# Patient Record
Sex: Female | Born: 1935 | Race: White | Hispanic: No | Marital: Single | Smoking: Former smoker
Health system: Southern US, Community
[De-identification: ages and names within clinical notes are randomized; demographics above are authoritative.]

## PROBLEM LIST (undated history)

## (undated) DIAGNOSIS — H409 Unspecified glaucoma: Secondary | ICD-10-CM

## (undated) DIAGNOSIS — L719 Rosacea, unspecified: Secondary | ICD-10-CM

## (undated) DIAGNOSIS — K529 Noninfective gastroenteritis and colitis, unspecified: Secondary | ICD-10-CM

## (undated) DIAGNOSIS — K219 Gastro-esophageal reflux disease without esophagitis: Secondary | ICD-10-CM

## (undated) DIAGNOSIS — M1990 Unspecified osteoarthritis, unspecified site: Secondary | ICD-10-CM

## (undated) DIAGNOSIS — C801 Malignant (primary) neoplasm, unspecified: Secondary | ICD-10-CM

## (undated) DIAGNOSIS — I10 Essential (primary) hypertension: Secondary | ICD-10-CM

## (undated) HISTORY — PX: PARTIAL HYSTERECTOMY: SHX80

## (undated) HISTORY — PX: OTHER SURGICAL HISTORY: SHX169

---

## 1999-12-16 ENCOUNTER — Encounter (INDEPENDENT_AMBULATORY_CARE_PROVIDER_SITE_OTHER): Payer: Self-pay | Admitting: Specialist

## 1999-12-16 ENCOUNTER — Other Ambulatory Visit: Admission: RE | Admit: 1999-12-16 | Discharge: 1999-12-16 | Payer: Self-pay | Admitting: Gastroenterology

## 2005-11-25 ENCOUNTER — Encounter: Admission: RE | Admit: 2005-11-25 | Discharge: 2005-11-25 | Payer: Self-pay | Admitting: Internal Medicine

## 2008-05-25 ENCOUNTER — Encounter: Admission: RE | Admit: 2008-05-25 | Discharge: 2008-05-25 | Payer: Self-pay | Admitting: Family Medicine

## 2008-09-26 ENCOUNTER — Encounter: Admission: RE | Admit: 2008-09-26 | Discharge: 2008-09-26 | Payer: Self-pay | Admitting: Family Medicine

## 2012-02-11 ENCOUNTER — Other Ambulatory Visit: Payer: Self-pay | Admitting: Physician Assistant

## 2013-06-14 ENCOUNTER — Other Ambulatory Visit: Payer: Self-pay | Admitting: Gastroenterology

## 2013-06-14 DIAGNOSIS — R112 Nausea with vomiting, unspecified: Secondary | ICD-10-CM

## 2013-06-14 DIAGNOSIS — R1031 Right lower quadrant pain: Secondary | ICD-10-CM

## 2013-06-15 ENCOUNTER — Observation Stay (HOSPITAL_COMMUNITY): Payer: Medicare Other | Admitting: Anesthesiology

## 2013-06-15 ENCOUNTER — Encounter (HOSPITAL_COMMUNITY): Payer: Medicare Other | Admitting: Anesthesiology

## 2013-06-15 ENCOUNTER — Ambulatory Visit
Admission: RE | Admit: 2013-06-15 | Discharge: 2013-06-15 | Disposition: A | Discharge status: Home / Self Care | Payer: Medicare Other | Source: Ambulatory Visit | Attending: Gastroenterology | Admitting: Gastroenterology

## 2013-06-15 ENCOUNTER — Emergency Department (HOSPITAL_COMMUNITY): Payer: Medicare Other

## 2013-06-15 ENCOUNTER — Encounter (HOSPITAL_COMMUNITY): Payer: Self-pay | Admitting: Emergency Medicine

## 2013-06-15 ENCOUNTER — Observation Stay (HOSPITAL_COMMUNITY)
Admission: EM | Admit: 2013-06-15 | Discharge: 2013-06-16 | Disposition: A | Discharge status: Home / Self Care | Payer: Medicare Other | Attending: General Surgery | Admitting: General Surgery

## 2013-06-15 ENCOUNTER — Encounter (HOSPITAL_COMMUNITY)
Admission: EM | Disposition: A | Discharge status: Home / Self Care | Payer: Self-pay | Source: Home / Self Care | Attending: Emergency Medicine

## 2013-06-15 DIAGNOSIS — R1031 Right lower quadrant pain: Secondary | ICD-10-CM

## 2013-06-15 DIAGNOSIS — R112 Nausea with vomiting, unspecified: Secondary | ICD-10-CM

## 2013-06-15 DIAGNOSIS — H409 Unspecified glaucoma: Secondary | ICD-10-CM | POA: Insufficient documentation

## 2013-06-15 DIAGNOSIS — K3580 Unspecified acute appendicitis: Principal | ICD-10-CM | POA: Diagnosis present

## 2013-06-15 DIAGNOSIS — Z79899 Other long term (current) drug therapy: Secondary | ICD-10-CM | POA: Insufficient documentation

## 2013-06-15 DIAGNOSIS — E785 Hyperlipidemia, unspecified: Secondary | ICD-10-CM | POA: Insufficient documentation

## 2013-06-15 DIAGNOSIS — F17200 Nicotine dependence, unspecified, uncomplicated: Secondary | ICD-10-CM | POA: Insufficient documentation

## 2013-06-15 DIAGNOSIS — K37 Unspecified appendicitis: Secondary | ICD-10-CM

## 2013-06-15 DIAGNOSIS — I10 Essential (primary) hypertension: Secondary | ICD-10-CM | POA: Insufficient documentation

## 2013-06-15 HISTORY — DX: Unspecified glaucoma: H40.9

## 2013-06-15 HISTORY — DX: Noninfective gastroenteritis and colitis, unspecified: K52.9

## 2013-06-15 HISTORY — DX: Essential (primary) hypertension: I10

## 2013-06-15 HISTORY — DX: Rosacea, unspecified: L71.9

## 2013-06-15 HISTORY — PX: LAPAROSCOPIC APPENDECTOMY: SHX408

## 2013-06-15 LAB — CBC WITH DIFFERENTIAL/PLATELET
BASOS ABS: 0 10*3/uL (ref 0.0–0.1)
BASOS PCT: 0 % (ref 0–1)
EOS ABS: 0 10*3/uL (ref 0.0–0.7)
Eosinophils Relative: 0 % (ref 0–5)
HCT: 40.3 % (ref 36.0–46.0)
HEMOGLOBIN: 14.5 g/dL (ref 12.0–15.0)
Lymphocytes Relative: 7 % — ABNORMAL LOW (ref 12–46)
Lymphs Abs: 1 10*3/uL (ref 0.7–4.0)
MCH: 32.5 pg (ref 26.0–34.0)
MCHC: 36 g/dL (ref 30.0–36.0)
MCV: 90.4 fL (ref 78.0–100.0)
MONOS PCT: 9 % (ref 3–12)
Monocytes Absolute: 1.4 10*3/uL — ABNORMAL HIGH (ref 0.1–1.0)
NEUTROS ABS: 12.7 10*3/uL — AB (ref 1.7–7.7)
NEUTROS PCT: 84 % — AB (ref 43–77)
PLATELETS: 215 10*3/uL (ref 150–400)
RBC: 4.46 MIL/uL (ref 3.87–5.11)
RDW: 14.3 % (ref 11.5–15.5)
WBC: 15.1 10*3/uL — ABNORMAL HIGH (ref 4.0–10.5)

## 2013-06-15 LAB — URINALYSIS, ROUTINE W REFLEX MICROSCOPIC
BILIRUBIN URINE: NEGATIVE
Glucose, UA: NEGATIVE mg/dL
Ketones, ur: NEGATIVE mg/dL
NITRITE: NEGATIVE
PH: 5.5 (ref 5.0–8.0)
Protein, ur: NEGATIVE mg/dL
SPECIFIC GRAVITY, URINE: 1.042 — AB (ref 1.005–1.030)
UROBILINOGEN UA: 0.2 mg/dL (ref 0.0–1.0)

## 2013-06-15 LAB — BASIC METABOLIC PANEL
BUN: 16 mg/dL (ref 6–23)
CHLORIDE: 98 meq/L (ref 96–112)
CO2: 24 mEq/L (ref 19–32)
Calcium: 9.3 mg/dL (ref 8.4–10.5)
Creatinine, Ser: 0.69 mg/dL (ref 0.50–1.10)
GFR, EST NON AFRICAN AMERICAN: 83 mL/min — AB (ref >90)
Glucose, Bld: 90 mg/dL (ref 70–99)
POTASSIUM: 3.7 meq/L (ref 3.7–5.3)
SODIUM: 137 meq/L (ref 137–147)

## 2013-06-15 LAB — URINE MICROSCOPIC-ADD ON

## 2013-06-15 SURGERY — APPENDECTOMY, LAPAROSCOPIC
Anesthesia: General | Site: Abdomen

## 2013-06-15 MED ORDER — ROCURONIUM BROMIDE 50 MG/5ML IV SOLN
INTRAVENOUS | Status: AC
  Filled 2013-06-15: qty 1

## 2013-06-15 MED ORDER — LEVOCETIRIZINE DIHYDROCHLORIDE 5 MG PO TABS: 5.0000 mg | ORAL_TABLET | Freq: Every evening | ORAL | Status: DC

## 2013-06-15 MED ORDER — BUPIVACAINE-EPINEPHRINE (PF) 0.25% -1:200000 IJ SOLN
INTRAMUSCULAR | Status: AC
  Filled 2013-06-15: qty 30

## 2013-06-15 MED ORDER — FUROSEMIDE 20 MG PO TABS
20.0000 mg | ORAL_TABLET | Freq: Every day | ORAL | Status: DC
  Filled 2013-06-15: qty 1

## 2013-06-15 MED ORDER — RALOXIFENE HCL 60 MG PO TABS
60.0000 mg | ORAL_TABLET | Freq: Every day | ORAL | Status: DC
  Filled 2013-06-15: qty 1

## 2013-06-15 MED ORDER — IRBESARTAN 300 MG PO TABS
300.0000 mg | ORAL_TABLET | Freq: Every day | ORAL | Status: DC
  Filled 2013-06-15: qty 1

## 2013-06-15 MED ORDER — PHENYLEPHRINE HCL 10 MG/ML IJ SOLN
INTRAMUSCULAR | Status: DC | PRN
  Administered 2013-06-15: 80 ug via INTRAVENOUS
  Administered 2013-06-15 (×2): 120 ug via INTRAVENOUS

## 2013-06-15 MED ORDER — HYDROCODONE-ACETAMINOPHEN 5-325 MG PO TABS
1.0000 | ORAL_TABLET | ORAL | Status: DC | PRN
Start: 2013-06-15 — End: 2013-06-16

## 2013-06-15 MED ORDER — PROMETHAZINE HCL 25 MG/ML IJ SOLN: 6.2500 mg | INTRAMUSCULAR | Status: DC | PRN

## 2013-06-15 MED ORDER — LIDOCAINE HCL (CARDIAC) 20 MG/ML IV SOLN
INTRAVENOUS | Status: AC
  Filled 2013-06-15: qty 5

## 2013-06-15 MED ORDER — OMEGA-3-ACID ETHYL ESTERS 1 G PO CAPS: 2.0000 g | ORAL_CAPSULE | Freq: Two times a day (BID) | ORAL | Status: DC

## 2013-06-15 MED ORDER — GLYCOPYRROLATE 0.2 MG/ML IJ SOLN
INTRAMUSCULAR | Status: DC | PRN
  Administered 2013-06-15: 0.4 mg via INTRAVENOUS

## 2013-06-15 MED ORDER — MIDAZOLAM HCL 2 MG/2ML IJ SOLN
INTRAMUSCULAR | Status: AC
  Filled 2013-06-15: qty 2

## 2013-06-15 MED ORDER — DOXYCYCLINE HYCLATE 100 MG PO TBEC: 100.0000 mg | DELAYED_RELEASE_TABLET | Freq: Two times a day (BID) | ORAL | Status: DC

## 2013-06-15 MED ORDER — SUCCINYLCHOLINE CHLORIDE 20 MG/ML IJ SOLN
INTRAMUSCULAR | Status: DC | PRN
  Administered 2013-06-15: 100 mg via INTRAVENOUS

## 2013-06-15 MED ORDER — PROPOFOL 10 MG/ML IV BOLUS
INTRAVENOUS | Status: DC | PRN
  Administered 2013-06-15: 150 mg via INTRAVENOUS

## 2013-06-15 MED ORDER — HYDROMORPHONE HCL PF 1 MG/ML IJ SOLN: 0.5000 mg | INTRAMUSCULAR | Status: DC | PRN

## 2013-06-15 MED ORDER — EZETIMIBE-SIMVASTATIN 10-40 MG PO TABS
1.0000 | ORAL_TABLET | Freq: Every day | ORAL | Status: DC
  Administered 2013-06-15: 1 via ORAL
  Filled 2013-06-15 (×3): qty 1

## 2013-06-15 MED ORDER — BUPIVACAINE-EPINEPHRINE 0.25% -1:200000 IJ SOLN
INTRAMUSCULAR | Status: DC | PRN
  Administered 2013-06-15: 16 mL

## 2013-06-15 MED ORDER — ONDANSETRON HCL 4 MG/2ML IJ SOLN
INTRAMUSCULAR | Status: DC | PRN
  Administered 2013-06-15: 4 mg via INTRAVENOUS

## 2013-06-15 MED ORDER — DEXAMETHASONE SODIUM PHOSPHATE 10 MG/ML IJ SOLN
INTRAMUSCULAR | Status: DC | PRN
  Administered 2013-06-15: 10 mg via INTRAVENOUS

## 2013-06-15 MED ORDER — AZELAIC ACID 15 % EX GEL: 1.0000 "application " | Freq: Every day | CUTANEOUS | Status: DC

## 2013-06-15 MED ORDER — ONDANSETRON HCL 4 MG/2ML IJ SOLN: 4.0000 mg | Freq: Four times a day (QID) | INTRAMUSCULAR | Status: DC | PRN

## 2013-06-15 MED ORDER — SBI/PROTEIN ISOLATE 5 G PO PACK
5.0000 mg | PACK | Freq: Every day | ORAL | Status: DC
Start: 2013-06-15 — End: 2013-06-15

## 2013-06-15 MED ORDER — DEXAMETHASONE SODIUM PHOSPHATE 10 MG/ML IJ SOLN
INTRAMUSCULAR | Status: AC
  Filled 2013-06-15: qty 1

## 2013-06-15 MED ORDER — IOHEXOL 300 MG/ML  SOLN
100.0000 mL | Freq: Once | INTRAMUSCULAR | Status: AC | PRN
  Administered 2013-06-15: 100 mL via INTRAVENOUS

## 2013-06-15 MED ORDER — NEOSTIGMINE METHYLSULFATE 10 MG/10ML IV SOLN
INTRAVENOUS | Status: DC | PRN
  Administered 2013-06-15: 3 mg via INTRAVENOUS

## 2013-06-15 MED ORDER — ONDANSETRON HCL 4 MG/2ML IJ SOLN
4.0000 mg | Freq: Once | INTRAMUSCULAR | Status: AC
  Administered 2013-06-15: 4 mg via INTRAVENOUS
  Filled 2013-06-15: qty 2

## 2013-06-15 MED ORDER — OXYCODONE-ACETAMINOPHEN 5-325 MG PO TABS: 1.0000 | ORAL_TABLET | ORAL | Status: DC | PRN

## 2013-06-15 MED ORDER — HYDROMORPHONE HCL PF 1 MG/ML IJ SOLN: 1.0000 mg | INTRAMUSCULAR | Status: DC | PRN

## 2013-06-15 MED ORDER — ONDANSETRON HCL 4 MG PO TABS: 4.0000 mg | ORAL_TABLET | Freq: Four times a day (QID) | ORAL | Status: DC | PRN

## 2013-06-15 MED ORDER — FENTANYL CITRATE 0.05 MG/ML IJ SOLN
INTRAMUSCULAR | Status: DC | PRN
  Administered 2013-06-15: 100 ug via INTRAVENOUS

## 2013-06-15 MED ORDER — HYDROMORPHONE HCL PF 1 MG/ML IJ SOLN: 0.2500 mg | INTRAMUSCULAR | Status: DC | PRN

## 2013-06-15 MED ORDER — CIPROFLOXACIN IN D5W 400 MG/200ML IV SOLN
400.0000 mg | Freq: Two times a day (BID) | INTRAVENOUS | Status: DC
  Administered 2013-06-15 – 2013-06-16 (×2): 400 mg via INTRAVENOUS
  Filled 2013-06-15 (×3): qty 200

## 2013-06-15 MED ORDER — KCL IN DEXTROSE-NACL 20-5-0.45 MEQ/L-%-% IV SOLN
INTRAVENOUS | Status: AC
  Filled 2013-06-15: qty 1000

## 2013-06-15 MED ORDER — ASPIRIN 81 MG PO TABS: 81.0000 mg | ORAL_TABLET | Freq: Every day | ORAL | Status: DC

## 2013-06-15 MED ORDER — DOXYCYCLINE HYCLATE 100 MG PO TABS
100.0000 mg | ORAL_TABLET | Freq: Two times a day (BID) | ORAL | Status: DC
  Administered 2013-06-15: 100 mg via ORAL
  Filled 2013-06-15 (×3): qty 1

## 2013-06-15 MED ORDER — 0.9 % SODIUM CHLORIDE (POUR BTL) OPTIME
TOPICAL | Status: DC | PRN
  Administered 2013-06-15: 1000 mL

## 2013-06-15 MED ORDER — SODIUM CHLORIDE 0.9 % IR SOLN
Status: DC | PRN
  Administered 2013-06-15: 1000 mL

## 2013-06-15 MED ORDER — CIPROFLOXACIN IN D5W 400 MG/200ML IV SOLN
400.0000 mg | Freq: Two times a day (BID) | INTRAVENOUS | Status: AC
  Filled 2013-06-15: qty 200

## 2013-06-15 MED ORDER — SODIUM CHLORIDE 0.9 % IV SOLN: INTRAVENOUS | Status: DC

## 2013-06-15 MED ORDER — LIDOCAINE HCL (CARDIAC) 20 MG/ML IV SOLN
INTRAVENOUS | Status: DC | PRN
  Administered 2013-06-15: 80 mg via INTRAVENOUS

## 2013-06-15 MED ORDER — ENOXAPARIN SODIUM 40 MG/0.4ML ~~LOC~~ SOLN: 40.0000 mg | SUBCUTANEOUS | Status: DC

## 2013-06-15 MED ORDER — ONDANSETRON HCL 4 MG/2ML IJ SOLN
INTRAMUSCULAR | Status: AC
  Filled 2013-06-15: qty 2

## 2013-06-15 MED ORDER — ROCURONIUM BROMIDE 100 MG/10ML IV SOLN
INTRAVENOUS | Status: DC | PRN
  Administered 2013-06-15: 25 mg via INTRAVENOUS

## 2013-06-15 MED ORDER — MORPHINE SULFATE 4 MG/ML IJ SOLN
6.0000 mg | Freq: Once | INTRAMUSCULAR | Status: AC
  Administered 2013-06-15: 6 mg via INTRAVENOUS
  Filled 2013-06-15: qty 2

## 2013-06-15 MED ORDER — FENTANYL CITRATE 0.05 MG/ML IJ SOLN
INTRAMUSCULAR | Status: AC
  Filled 2013-06-15: qty 5

## 2013-06-15 MED ORDER — ACETAMINOPHEN 325 MG PO TABS: 650.0000 mg | ORAL_TABLET | Freq: Four times a day (QID) | ORAL | Status: DC | PRN

## 2013-06-15 MED ORDER — GLYCOPYRROLATE 0.2 MG/ML IJ SOLN
INTRAMUSCULAR | Status: AC
  Filled 2013-06-15: qty 2

## 2013-06-15 MED ORDER — LACTATED RINGERS IV SOLN
INTRAVENOUS | Status: DC
  Administered 2013-06-15: 13:00:00 via INTRAVENOUS

## 2013-06-15 MED ORDER — HEPARIN SODIUM (PORCINE) 5000 UNIT/ML IJ SOLN
5000.0000 [IU] | Freq: Three times a day (TID) | INTRAMUSCULAR | Status: DC
  Administered 2013-06-16: 5000 [IU] via SUBCUTANEOUS
  Filled 2013-06-15 (×3): qty 1

## 2013-06-15 MED ORDER — KCL IN DEXTROSE-NACL 20-5-0.45 MEQ/L-%-% IV SOLN
INTRAVENOUS | Status: DC
  Administered 2013-06-15: 90 mL/h via INTRAVENOUS
  Filled 2013-06-15 (×3): qty 1000

## 2013-06-15 MED ORDER — ACETAMINOPHEN 650 MG RE SUPP: 650.0000 mg | Freq: Four times a day (QID) | RECTAL | Status: DC | PRN

## 2013-06-15 MED ORDER — LORATADINE 10 MG PO TABS
5.0000 mg | ORAL_TABLET | Freq: Every day | ORAL | Status: DC
  Administered 2013-06-15: 5 mg via ORAL
  Filled 2013-06-15: qty 0.5
  Filled 2013-06-15: qty 1

## 2013-06-15 MED ORDER — LATANOPROST 0.005 % OP SOLN: 1.0000 [drp] | Freq: Every day | OPHTHALMIC | Status: DC

## 2013-06-15 MED ORDER — PROPOFOL 10 MG/ML IV BOLUS
INTRAVENOUS | Status: AC
  Filled 2013-06-15: qty 20

## 2013-06-15 SURGICAL SUPPLY — 50 items
APPLIER CLIP ROT 10 11.4 M/L (STAPLE)
BLADE SURG ROTATE 9660 (MISCELLANEOUS) ×3 IMPLANT
CANISTER SUCTION 2500CC (MISCELLANEOUS) ×3 IMPLANT
CHLORAPREP W/TINT 26ML (MISCELLANEOUS) ×3 IMPLANT
CLIP APPLIE ROT 10 11.4 M/L (STAPLE) IMPLANT
CLOSURE WOUND 1/2 X4 (GAUZE/BANDAGES/DRESSINGS) ×1
COVER SURGICAL LIGHT HANDLE (MISCELLANEOUS) ×3 IMPLANT
CUTTER LINEAR ENDO 35 ETS (STAPLE) ×3 IMPLANT
CUTTER LINEAR ENDO 35 ETS TH (STAPLE) IMPLANT
DECANTER SPIKE VIAL GLASS SM (MISCELLANEOUS) IMPLANT
DERMABOND ADVANCED (GAUZE/BANDAGES/DRESSINGS) ×2
DERMABOND ADVANCED .7 DNX12 (GAUZE/BANDAGES/DRESSINGS) ×1 IMPLANT
DRAPE UTILITY 15X26 W/TAPE STR (DRAPE) ×6 IMPLANT
DRSG TEGADERM 2-3/8X2-3/4 SM (GAUZE/BANDAGES/DRESSINGS) ×9 IMPLANT
ELECT REM PT RETURN 9FT ADLT (ELECTROSURGICAL) ×3
ELECTRODE REM PT RTRN 9FT ADLT (ELECTROSURGICAL) ×1 IMPLANT
ENDOLOOP SUT PDS II  0 18 (SUTURE)
ENDOLOOP SUT PDS II 0 18 (SUTURE) IMPLANT
GLOVE BIO SURGEON STRL SZ7.5 (GLOVE) ×3 IMPLANT
GLOVE BIOGEL PI IND STRL 6.5 (GLOVE) ×2 IMPLANT
GLOVE BIOGEL PI IND STRL 7.5 (GLOVE) ×1 IMPLANT
GLOVE BIOGEL PI IND STRL 8 (GLOVE) ×1 IMPLANT
GLOVE BIOGEL PI INDICATOR 6.5 (GLOVE) ×4
GLOVE BIOGEL PI INDICATOR 7.5 (GLOVE) ×2
GLOVE BIOGEL PI INDICATOR 8 (GLOVE) ×2
GLOVE ECLIPSE 7.5 STRL STRAW (GLOVE) ×3 IMPLANT
GLOVE SURG SS PI 7.0 STRL IVOR (GLOVE) ×3 IMPLANT
GOWN STRL REUS W/ TWL LRG LVL3 (GOWN DISPOSABLE) ×2 IMPLANT
GOWN STRL REUS W/TWL LRG LVL3 (GOWN DISPOSABLE) ×4
KIT BASIN OR (CUSTOM PROCEDURE TRAY) ×3 IMPLANT
KIT ROOM TURNOVER OR (KITS) ×3 IMPLANT
NS IRRIG 1000ML POUR BTL (IV SOLUTION) ×3 IMPLANT
PAD ARMBOARD 7.5X6 YLW CONV (MISCELLANEOUS) ×6 IMPLANT
PENCIL BUTTON HOLSTER BLD 10FT (ELECTRODE) IMPLANT
POUCH SPECIMEN RETRIEVAL 10MM (ENDOMECHANICALS) ×3 IMPLANT
RELOAD /EVU35 (ENDOMECHANICALS) IMPLANT
RELOAD CUTTER ETS 35MM STAND (ENDOMECHANICALS) IMPLANT
SCALPEL HARMONIC ACE (MISCELLANEOUS) ×3 IMPLANT
SET IRRIG TUBING LAPAROSCOPIC (IRRIGATION / IRRIGATOR) ×3 IMPLANT
SPECIMEN JAR SMALL (MISCELLANEOUS) ×3 IMPLANT
STRIP CLOSURE SKIN 1/2X4 (GAUZE/BANDAGES/DRESSINGS) ×2 IMPLANT
SUT MNCRL AB 4-0 PS2 18 (SUTURE) ×3 IMPLANT
TOWEL OR 17X24 6PK STRL BLUE (TOWEL DISPOSABLE) IMPLANT
TOWEL OR 17X26 10 PK STRL BLUE (TOWEL DISPOSABLE) ×3 IMPLANT
TRAY FOLEY CATH 16FR SILVER (SET/KITS/TRAYS/PACK) ×3 IMPLANT
TRAY LAPAROSCOPIC (CUSTOM PROCEDURE TRAY) ×3 IMPLANT
TROCAR XCEL 12X100 BLDLESS (ENDOMECHANICALS) ×3 IMPLANT
TROCAR XCEL BLUNT TIP 100MML (ENDOMECHANICALS) ×3 IMPLANT
TROCAR XCEL NON-BLD 5MMX100MML (ENDOMECHANICALS) ×3 IMPLANT
WATER STERILE IRR 1000ML POUR (IV SOLUTION) IMPLANT

## 2013-06-15 NOTE — H&P (Signed)
For surgery soon.  Talked with the patient and her son.  Ready for the OR.  Alicia Rhodes. Dahlia Bailiff, MD, [place] [phone] [facility]

## 2013-06-15 NOTE — Anesthesia Preprocedure Evaluation (Signed)
Anesthesia Evaluation  Patient identified by MRN, date of birth, ID band Patient awake    Reviewed: Allergy & Precautions, H&P , NPO status , Patient's Chart, lab work & pertinent test results  Airway Mallampati: II TM Distance: <3 FB Neck ROM: Full    Dental no notable dental hx.    Pulmonary Current Smoker,  breath sounds clear to auscultation  Pulmonary exam normal       Cardiovascular hypertension, Pt. on medications Rhythm:Regular Rate:Normal     Neuro/Psych negative neurological ROS  negative psych ROS   GI/Hepatic negative GI ROS, Neg liver ROS,   Endo/Other  negative endocrine ROS  Renal/GU negative Renal ROS  negative genitourinary   Musculoskeletal negative musculoskeletal ROS (+)   Abdominal   Peds negative pediatric ROS (+)  Hematology negative hematology ROS (+)   Anesthesia Other Findings   Reproductive/Obstetrics negative OB ROS                           Anesthesia Physical Anesthesia Plan  ASA: II  Anesthesia Plan: General   Post-op Pain Management:    Induction: Intravenous  Airway Management Planned: Oral ETT  Additional Equipment:   Intra-op Plan:   Post-operative Plan: Extubation in OR  Informed Consent: I have reviewed the patients History and Physical, chart, labs and discussed the procedure including the risks, benefits and alternatives for the proposed anesthesia with the patient or authorized representative who has indicated his/her understanding and acceptance.   Dental advisory given  Plan Discussed with: CRNA and Surgeon  Anesthesia Plan Comments:         Anesthesia Quick Evaluation  

## 2013-06-15 NOTE — Op Note (Signed)
OPERATIVE REPORT  DATE OF OPERATION: 06/15/2013  PATIENT:  Alicia Rhodes  78 y.o. female  PRE-OPERATIVE DIAGNOSIS:  acute appendicitis  POST-OPERATIVE DIAGNOSIS:  acute appendicitis  PROCEDURE:  Procedure(s): APPENDECTOMY LAPAROSCOPIC  SURGEON:  Surgeon(s): Gwenyth Ober, MD  ASSISTANT: None  ANESTHESIA:   general  EBL: <30 ml  BLOOD ADMINISTERED: none  DRAINS: none   SPECIMEN:  Source of Specimen:  Gangrenous appendix  COUNTS CORRECT:  YES  PROCEDURE DETAILS: The patient was taken to the operating room and placed on the table in supine position. After an adequate endotracheal anesthetic was administered she was prepped and draped in the usual sterile manner exposing her abdomen.  A proper timeout was performed identifying the patient and the procedure to be performed. A supraumbilical midline incision was made using a #15 blade and taken down to the midline fascia. We incised in the midline fascia and then bluntly dissected down into the peritoneal cavity. A pursestring suture of 0 Vicryl was passed around the fascial opening which secured in a Hassan cannula.  Through the [facility] cannula carbon dioxide gas was insufflated into the peritoneal cavity up to a maximal intra-abdominal pressure of 15 mm mercury. Once this was done right upper quadrant 5 mm cannula and the left low quadrant 1112 mm cannula passed under direct vision.  Is noted that those inflammatory process in the right low quadrant. Upon dissecting out the appendix was noted to be gangrenous but not perforated. We will able to isolate the knees appendix from the base of the appendix and using a Harmonic Scalpel we were able to take down the mesoappendix. This left the base of the appendix which was enlarged. We came across the base at the cecum using an Endo GIA 35 mm stapler.  Once this was done the appendix was completely detached. We passed the appendix into an Endo Catch bag which we retrieved from the left low  quadrant cannula site. Once this was done we reinserted the cannula and irrigated the right lower quadrant with saline. Electrocautery was used to attain more hemostasis. Once this was done we aspirated all fluid and gas from the abdominal cavity. We removed the cannulas. The supraumbilical cannula fascia site was closed using the pursestring suture which was in place. 0.25% Marcaine with epinephrine was injected all sites. We closed the skin using running subcuticular stitch of 4-0 Monocryl. All needle counts, sponge counts, and instrument counts were correct.  PATIENT DISPOSITION:  PACU - hemodynamically stable.   Gwenyth Ober 5/28/20153:30 PM

## 2013-06-15 NOTE — H&P (Signed)
Chief Complaint: RLQ abdominal pain  HPI: Alicia Rhodes is a 78 year old female with a history of HTN, colitis, HLD, glaucoma, partial hysterectomy who presents to [facility] with RLQ abdominal pain.  Duration of symptoms 2 days.  Onset was sudden around 11PM on Tuesday night. Time pattern is constant.  Waxes and wanes in severity.  Moderate to severity in severity.  Aggravating factors; none.  Alleviating factors; none.  Modifying factors; none. Characterized as sharp pain which improves to a constant dull ache.  Associating factors; vomiting x2 episodes on Tuesday.  Her pain was initially generalized, then migrated to the RLQ and improved after episodes of emesis.  She was seen by Dr. Benson Norway yesterday and referred for a CT scan.  CT scan of abdomen and pelvis revealed acute appendicitis with inflammatory changes, no definite abscess.  White count of 15K.  Vital signs are stable.  She is afebrile.  Normal renal function.  She does not take any blood thinners.  She takes voltaren for knee pain.  She smokes 2-3 cigarettes per day.  Denies alcohol or drug use.    Past Medical History  Diagnosis Date  . Colitis   . Hypertension   . Glaucoma   . Rosacea     Past Surgical History  Procedure Laterality Date  . Partial hysterectomy     Home Medication: ASA 81mg  PO daily Finacea 16% cream 1 application daily lumigan .01% solution 1 drop to both eyes at bedtime Calcium 1200-D3  1 table PO BID Voltaren 50mg  PO BID Doxycycline 100mg  PO BID Vytorin 10-40mg  1 tablet PO daily vanos 1.09% cream 1 application topically as needed(rosacea) Lasix 20mg  PO daily(swelling, no hx CHF) Xyzal 5mg  at bedtime benicar 40mg  1 tablet PO daily lovaza 1 gram 2grams by mouth BID evista 60mg  1 tablet PO daily Enteragam 5 G Pack take 5mg  by mouth at bedtime Retin-A 6.04% cream 1 application topically at bedtime History reviewed. No pertinent family history. Social History:  reports that she has been smoking Cigarettes.  She  has been smoking about 0.00 packs per day. She does not have any smokeless tobacco history on file. She reports that she does not drink alcohol or use illicit drugs.  Allergies:  Allergies  Allergen Reactions  . Cyclobenzaprine Hives  . Naproxen Sodium Hives  . Penicillins Hives   Filed Vitals:   06/15/13 1115  BP: 132/61  Pulse: 97  Temp:   Resp:     (Not in a hospital admission)  Results for orders placed during the hospital encounter of 06/15/13 (from the past 48 hour(s))  CBC WITH DIFFERENTIAL     Status: Abnormal   Collection Time    06/15/13 11:14 AM      Result Value Ref Range   WBC 15.1 (*) 4.0 - 10.5 K/uL   RBC 4.46  3.87 - 5.11 MIL/uL   Hemoglobin 14.5  12.0 - 15.0 g/dL   HCT 40.3  36.0 - 46.0 %   MCV 90.4  78.0 - 100.0 fL   MCH 32.5  26.0 - 34.0 pg   MCHC 36.0  30.0 - 36.0 g/dL   RDW 14.3  11.5 - 15.5 %   Platelets 215  150 - 400 K/uL   Neutrophils Relative % 84 (*) 43 - 77 %   Neutro Abs 12.7 (*) 1.7 - 7.7 K/uL   Lymphocytes Relative 7 (*) 12 - 46 %   Lymphs Abs 1.0  0.7 - 4.0 K/uL   Monocytes Relative 9  3 - 12 %   Monocytes Absolute 1.4 (*) 0.1 - 1.0 K/uL   Eosinophils Relative 0  0 - 5 %   Eosinophils Absolute 0.0  0.0 - 0.7 K/uL   Basophils Relative 0  0 - 1 %   Basophils Absolute 0.0  0.0 - 0.1 K/uL  URINALYSIS, ROUTINE W REFLEX MICROSCOPIC     Status: Abnormal   Collection Time    06/15/13 11:27 AM      Result Value Ref Range   Color, Urine YELLOW  YELLOW   APPearance HAZY (*) CLEAR   Specific Gravity, Urine 1.042 (*) 1.005 - 1.030   pH 5.5  5.0 - 8.0   Glucose, UA NEGATIVE  NEGATIVE mg/dL   Hgb urine dipstick SMALL (*) NEGATIVE   Bilirubin Urine NEGATIVE  NEGATIVE   Ketones, ur NEGATIVE  NEGATIVE mg/dL   Protein, ur NEGATIVE  NEGATIVE mg/dL   Urobilinogen, UA 0.2  0.0 - 1.0 mg/dL   Nitrite NEGATIVE  NEGATIVE   Leukocytes, UA SMALL (*) NEGATIVE  URINE MICROSCOPIC-ADD ON     Status: Abnormal   Collection Time    06/15/13 11:27 AM       Result Value Ref Range   Squamous Epithelial / LPF MANY (*) RARE   WBC, UA 3-6  <3 WBC/hpf   RBC / HPF 3-6  <3 RBC/hpf   Bacteria, UA FEW (*) RARE   Dg Chest 2 View  06/15/2013   CLINICAL DATA:  Pre operative respiratory exam for appendectomy.  EXAM: CHEST - 2 VIEW  COMPARISON:  None  FINDINGS: The heart size and mediastinal contours are within normal limits. The anterior right hemidiaphragm shows eventration. Mild bibasilar atelectasis is present. There is no evidence of pulmonary edema, consolidation, pneumothorax, nodule or pleural fluid. The bony thorax is unremarkable.  IMPRESSION: No active disease.  Elevation of anterior right hemidiaphragm.   Electronically Signed   By: Aletta Edouard M.D.   On: 06/15/2013 12:30   Ct Abdomen Pelvis W Contrast  06/15/2013   CLINICAL DATA:  Right lower quadrant pain with nausea and vomiting for 2 days  EXAM: CT ABDOMEN AND PELVIS WITH CONTRAST  TECHNIQUE: Multidetector CT imaging of the abdomen and pelvis was performed using the standard protocol following bolus administration of intravenous contrast.  CONTRAST:  164mL OMNIPAQUE IOHEXOL 300 MG/ML  SOLN  COMPARISON:  None.  FINDINGS: The lung bases are clear. A simple appearing cyst is noted in the lower right lobe of liver. No other hepatic abnormality is noted. The gallbladder is well seen with no calcified gallstones noted. The pancreas is normal in size and the pancreatic duct is not dilated. The adrenal glands and spleen are unremarkable. The stomach is moderately fluid distended with no abnormality noted. The kidneys enhance with no calculus or mass and on delayed images, the pelvocaliceal systems are unremarkable. The abdominal aorta is normal in caliber. No adenopathy is seen.  Within the right lower quadrant there is a dilated tubular structure measuring up to 14 mm with adjacent inflammatory reaction, consistent with acute appendicitis. There is edema of the base of the cecum at the appendiceal orifice as  well. No abscess is seen although slightly prominent lymph nodes are present within the surrounding soft tissues.  The urinary bladder is unremarkable. No free fluid is seen within the pelvis. There is a moderate amount of feces throughout the colon. There are mild degenerative changes throughout the lumbar spine particularly involving the facet joints.  IMPRESSION: Acute appendicitis  with inflammatory reaction in the right lower quadrant but no discrete abscess or other complicating feature. This report will be called to Dr. Benson Norway by the PRA.   Electronically Signed   By: Ivar Drape M.D.   On: 06/15/2013 10:05    Review of Systems  Constitutional: Positive for weight loss and malaise/fatigue. Negative for fever, chills and diaphoresis.  HENT: Negative for hearing loss.   Eyes: Negative for blurred vision and double vision.  Respiratory: Negative for shortness of breath and wheezing.   Cardiovascular: Negative for chest pain, palpitations and leg swelling.  Gastrointestinal: Positive for abdominal pain and diarrhea. Negative for nausea, vomiting, constipation, blood in stool and melena.  Genitourinary: Negative for dysuria, urgency, frequency, hematuria and flank pain.  Musculoskeletal: Negative for back pain and myalgias.  Neurological: Negative for dizziness, tingling, tremors, sensory change, speech change, focal weakness, seizures, loss of consciousness, weakness and headaches.  Psychiatric/Behavioral: Negative.     Blood pressure 132/61, pulse 97, temperature 97.8 F (36.6 C), temperature source Oral, resp. rate 18, SpO2 98.00%. Physical Exam  Constitutional: She is oriented to person, place, and time. She appears well-developed and well-nourished. No distress.  HENT:  Head: Normocephalic and atraumatic.  Neck: Normal range of motion. Neck supple. No thyromegaly present.  Cardiovascular: Normal rate, regular rhythm, normal heart sounds and intact distal pulses.  Exam reveals no gallop and  no friction rub.   No murmur heard. Respiratory: Effort normal and breath sounds normal. No respiratory distress. She has no wheezes. She has no rales. She exhibits no tenderness.  GI: Soft. Bowel sounds are normal. She exhibits no distension and no mass. There is no guarding.  +rebound tenderness, RLQ tenderness  Musculoskeletal: Normal range of motion. She exhibits no edema and no tenderness.  Lymphadenopathy:    She has no cervical adenopathy.  Neurological: She is alert and oriented to person, place, and time.  Skin: Skin is warm and dry. She is not diaphoretic.  Psychiatric: She has a normal mood and affect. Her behavior is normal. Judgment and thought content normal.     Assessment/Plan Acute appendicitis -admit to observation -proceed with a laparoscopic appendectomy today -obtain consent.  Risks of the procedure were discussed including but not limited to infection, bleeding, injury to surrounding organs.  She verbalizes understanding and wishes to proceed.  Son, Ronalee Belts, was at bedside. -cipro IV -IVF -pain control, antiemetics -NPO -SCDs, post op chemical VTE prophylaxis  HTN-continue with home meds    Passion Lavin  ANP-BC  Pager [phone] 06/15/2013, 12:47 PM

## 2013-06-15 NOTE — Transfer of Care (Signed)
Immediate Anesthesia Transfer of Care Note  Patient: Alicia Rhodes  Procedure(s) Performed: Procedure(s): APPENDECTOMY LAPAROSCOPIC (N/A)  Patient Location: PACU  Anesthesia Type:General  Level of Consciousness: awake  Airway & Oxygen Therapy: Patient Spontanous Breathing and Patient connected to nasal cannula oxygen  Post-op Assessment: Report given to PACU RN, Post -op Vital signs reviewed and stable and Patient moving all extremities  Post vital signs: Reviewed and stable  Complications: No apparent anesthesia complications

## 2013-06-15 NOTE — ED Provider Notes (Signed)
CSN: [id number]     Arrival date & time 06/15/13  1035 History   First MD Initiated Contact with Patient 06/15/13 1109     Chief Complaint  Patient presents with  . appendicitis     Patient sent by Dr. Benson Norway and was confirmed with CT at [facility].     (Consider location/radiation/quality/duration/timing/severity/associated sxs/prior Treatment) HPI  78 year old female with history colitis, hypertension who was sent here by Dr. Benson Norway after she was found to have a confirmed appendicitis on CT scan performed at [place] today. Patient reports 2 days ago she developed a gradual onset of generalized abdominal tenderness which she describes as sharp and achy sensation. Pain intensified throughout the day and spread to her entire abdomen. She felt nauseous and vomited up all of her food and felt better, however last night the pain got worse. She did follow up at doctor Hung's office, had lab work done and was recommended for CT scan which she had today and it confirmed appendicitis without abscess.  Currently patient endorse 8/10 left lower quadrant abdominal pain. She denies fever, chills , chest pain, short of breath, productive cough, back pain, dysuria, hematuria, vaginal discharge, or rash. She denies any recent trauma. Her last meal was yesterday morning. She has no appetite.  Past Medical History  Diagnosis Date  . Colitis   . Hypertension   . Glaucoma   . Rosacea    Past Surgical History  Procedure Laterality Date  . Partial hysterectomy     No family history on file. History  Substance Use Topics  . Smoking status: Not on file  . Smokeless tobacco: Not on file  . Alcohol Use: No   OB History   Grav Para Term Preterm Abortions TAB SAB Ect Mult Living                 Review of Systems  All other systems reviewed and are negative.     Allergies  Cyclobenzaprine; Naproxen sodium; and Penicillins  Home Medications   Prior to Admission medications    Medication Sig Start Date End Date Taking? Authorizing Provider  aspirin 81 MG tablet Take 81 mg by mouth daily.   Yes Historical Provider, MD  Azelaic Acid (FINACEA) 15 % cream Apply 1 application topically daily. After skin is thoroughly washed and patted dry, gently but thoroughly massage a thin film of azelaic acid cream into the affected area twice daily, in the morning and evening.   Yes Historical Provider, MD  bimatoprost (LUMIGAN) 0.01 % SOLN Place 1 drop into both eyes at bedtime.   Yes Historical Provider, MD  Calcium-Magnesium-Vitamin D (CALCIUM 1200+D3 PO) Take 1 tablet by mouth 2 (two) times daily.   Yes Historical Provider, MD  dexlansoprazole (DEXILANT) 60 MG capsule Take 60 mg by mouth daily.   Yes Historical Provider, MD  diclofenac (VOLTAREN) 50 MG EC tablet Take 50 mg by mouth 2 (two) times daily.   Yes Historical Provider, MD  doxycycline (DORYX) 100 MG EC tablet Take 100 mg by mouth 2 (two) times daily.   Yes Historical Provider, MD  ezetimibe-simvastatin (VYTORIN) 10-40 MG per tablet Take 1 tablet by mouth daily.   Yes Historical Provider, MD  fluocinolone (VANOS) 0.01 % cream Apply 1 application topically daily as needed (psoriasis).    Yes Historical Provider, MD  furosemide (LASIX) 20 MG tablet Take 20 mg by mouth daily.   Yes Historical Provider, MD  glucosamine-chondroitin 500-400 MG tablet Take 1 tablet by mouth  2 (two) times daily.   Yes Historical Provider, MD  levocetirizine (XYZAL) 5 MG tablet Take 5 mg by mouth every evening.   Yes Historical Provider, MD  olmesartan (BENICAR) 40 MG tablet Take 40 mg by mouth daily.   Yes Historical Provider, MD  omega-3 acid ethyl esters (LOVAZA) 1 G capsule Take 2 g by mouth 2 (two) times daily.   Yes Historical Provider, MD  raloxifene (EVISTA) 60 MG tablet Take 60 mg by mouth daily.   Yes Historical Provider, MD  SBI/Protein Isolate [facility]) 5 G PACK Take 5 mg by mouth at bedtime.   Yes Historical Provider, MD  tretinoin  (RETIN-A) 0.05 % cream Apply 1 application topically at bedtime.   Yes Historical Provider, MD   BP 126/72  Pulse 91  Temp(Src) 97.8 F (36.6 C) (Oral)  Resp 18  SpO2 98% Physical Exam  Nursing note and vitals reviewed. Constitutional: She is oriented to person, place, and time. She appears well-developed and well-nourished. No distress.  HENT:  Head: Atraumatic.  Eyes: Conjunctivae are normal.  Neck: Neck supple.  Cardiovascular: Normal rate and regular rhythm.   Pulmonary/Chest: Effort normal and breath sounds normal.  Abdominal: Soft. There is tenderness (right lower quadrant abdominal tenderness with guarding but no rebound tenderness. Abdomen otherwise soft with hyperactive bowel sounds. Positive obturator and psoas signs. No overlying skin changes.). There is guarding. There is no rebound.  Neurological: She is alert and oriented to person, place, and time.  Skin: No rash noted.  Psychiatric: She has a normal mood and affect.    ED Course  Procedures (including critical care time)  11:25 AM Patient presents with abdominal pain and it confirmed appendicitis without abscess on CT scan which was done today. Patient is well-appearing and likely is a candidate for surgery. I will obtain preop labs and imaging and will consult surgery for further management. Care discussed with Dr. Tamera Punt. Pain medication given.  12:56 PM General Surgery has evaluate pt and will take pt to OR appendectomy.  Pt currently stable.   Labs Review Labs Reviewed - No data to display  Imaging Review Ct Abdomen Pelvis W Contrast  06/15/2013   CLINICAL DATA:  Right lower quadrant pain with nausea and vomiting for 2 days  EXAM: CT ABDOMEN AND PELVIS WITH CONTRAST  TECHNIQUE: Multidetector CT imaging of the abdomen and pelvis was performed using the standard protocol following bolus administration of intravenous contrast.  CONTRAST:  159mL OMNIPAQUE IOHEXOL 300 MG/ML  SOLN  COMPARISON:  None.  FINDINGS: The  lung bases are clear. A simple appearing cyst is noted in the lower right lobe of liver. No other hepatic abnormality is noted. The gallbladder is well seen with no calcified gallstones noted. The pancreas is normal in size and the pancreatic duct is not dilated. The adrenal glands and spleen are unremarkable. The stomach is moderately fluid distended with no abnormality noted. The kidneys enhance with no calculus or mass and on delayed images, the pelvocaliceal systems are unremarkable. The abdominal aorta is normal in caliber. No adenopathy is seen.  Within the right lower quadrant there is a dilated tubular structure measuring up to 14 mm with adjacent inflammatory reaction, consistent with acute appendicitis. There is edema of the base of the cecum at the appendiceal orifice as well. No abscess is seen although slightly prominent lymph nodes are present within the surrounding soft tissues.  The urinary bladder is unremarkable. No free fluid is seen within the pelvis. There is a  moderate amount of feces throughout the colon. There are mild degenerative changes throughout the lumbar spine particularly involving the facet joints.  IMPRESSION: Acute appendicitis with inflammatory reaction in the right lower quadrant but no discrete abscess or other complicating feature. This report will be called to Dr. Benson Norway by the PRA.   Electronically Signed   By: Ivar Drape M.D.   On: 06/15/2013 10:05     EKG Interpretation None      MDM   Final diagnoses:  Appendicitis    BP 132/61  Pulse 97  Temp(Src) 97.8 F (36.6 C) (Oral)  Resp 18  SpO2 98%  I have reviewed nursing notes and vital signs. I personally reviewed the imaging tests through PACS system  I reviewed available ER/hospitalization records thought the EMR     Domenic Moras, [place] 06/15/13 1314

## 2013-06-15 NOTE — Progress Notes (Signed)
PHARMACIST - PHYSICIAN ORDER COMMUNICATION  CONCERNING: P&T Medication Policy on Herbal Medications  DESCRIPTION:  This patient's order for:  Enteragam  has been noted.  This product(s) is classified as a "medical food". Though it is a prescription item, it is not stocked at [facility], and would fall under the "herbal med" or natural product category.  Due to a lack of FDA approval, nonstandard manufacturing practices, plus the potential risk of unknown drug-drug interactions while on inpatient medications, the Pharmacy and Therapeutics Committee does not permit the use of "herbal" or natural products of this type within [facility].   ACTION TAKEN: The pharmacy department is unable to verify this order at this time.  Please reevaluate patient's clinical condition at discharge and address if the herbal or natural product(s) should be resumed at that time.   Consuello Masse, RPh Pager: [phone] 06/15/2013 6:09 PM

## 2013-06-15 NOTE — ED Notes (Signed)
Patient states started having abdominal pain Tuesday night.   Patient states she went to [place] [facility] today and they confirmed appendicitis and sent here by Dr. Benson Norway.

## 2013-06-15 NOTE — Anesthesia Postprocedure Evaluation (Signed)
  Anesthesia Post-op Note  Patient: Alicia Rhodes  Procedure(s) Performed: Procedure(s) (LRB): APPENDECTOMY LAPAROSCOPIC (N/A)  Patient Location: PACU  Anesthesia Type: General  Level of Consciousness: awake and alert   Airway and Oxygen Therapy: Patient Spontanous Breathing  Post-op Pain: mild  Post-op Assessment: Post-op Vital signs reviewed, Patient's Cardiovascular Status Stable, Respiratory Function Stable, Patent Airway and No signs of Nausea or vomiting  Last Vitals:  Filed Vitals:   06/15/13 1543  BP:   Pulse: 89  Temp: 36.9 C  Resp: 25    Post-op Vital Signs: stable   Complications: No apparent anesthesia complications

## 2013-06-15 NOTE — Progress Notes (Signed)
Alicia Rhodes stone, son has patient belongings.

## 2013-06-15 NOTE — ED Provider Notes (Addendum)
Medical screening examination/treatment/procedure(s) were conducted as a shared visit with non-physician practitioner(s) and myself.  I personally evaluated the patient during the encounter.   EKG Interpretation   Date/Time:  Thursday Jun 15 2013 11:45:49 EDT Ventricular Rate:  89 PR Interval:  169 QRS Duration: 133 QT Interval:  384 QTC Calculation: 467 R Axis:   45 Text Interpretation:  Sinus rhythm Right bundle branch block No old  tracing to compare Confirmed by Shanvi Moyd  MD, Joriel Streety ([id number]) on 06/15/2013  2:26:13 PM      Pt with lower abd pain, +appendicitis on outpt CT.  Will admit to surgery  Malvin Johns, MD 06/15/13 [place], MD 06/15/13 1426

## 2013-06-16 MED ORDER — HYDROCODONE-ACETAMINOPHEN 5-325 MG PO TABS: 1.0000 | ORAL_TABLET | Freq: Four times a day (QID) | ORAL | Status: DC | PRN

## 2013-06-16 NOTE — Progress Notes (Signed)
Discharge instructions and prescription for [facility] given and explained to pt.  Pt. Verbalizes understanding of all orders/instcutions and denies any quetsions.  IV removed.  VSS. Pt. Discharged to home with son in stable condition. Syliva Overman

## 2013-06-16 NOTE — Discharge Instructions (Signed)
See above

## 2013-06-16 NOTE — Discharge Summary (Signed)
Patient ID: Alicia Rhodes [id number] 78 y.o. [birth year]  Admit date: 06/15/2013  Discharge date and time: No discharge date for patient encounter.  Admitting Physician: Judeth Horn   Discharge Physician: Adin Hector  Admission Diagnoses: Appendicitis [541]  Discharge Diagnoses: same  Operations: Procedure(s): APPENDECTOMY LAPAROSCOPIC  Admission Condition: fair  Discharged Condition: good  Indication for Admission:  Alicia Rhodes is a 78 year old female with a history of HTN, colitis, HLD, glaucoma, partial hysterectomy who presents to [facility] with RLQ abdominal pain. Duration of symptoms 2 days. Onset was sudden around 11PM on Tuesday night. Time pattern is constant. Waxes and wanes in severity. Moderate to severity in severity. Aggravating factors; none. Alleviating factors; none. Modifying factors; none. Characterized as sharp pain which improves to a constant dull ache. Associating factors; vomiting x2 episodes on Tuesday. Her pain was initially generalized, then migrated to the RLQ and improved after episodes of emesis. She was seen by Dr. Benson Norway 06/14/2013  and referred for a CT scan. CT scan of abdomen and pelvis revealed acute appendicitis with inflammatory changes, no definite abscess. White count of 15K. Vital signs are stable. She was evaluated in the emergency department and admitted.  Hospital Course: On the day of admission which was yesterday, , she was admitted, given IV fluids and IV antibiotics and taken to the operating room. . Laparoscopic appendectomy by Dr. Hulen Skains. There was no evidence of rupture or abscess. She did well overnight advancing diet and activities. This morning she was alert and friendly and asking to go home. She was tolerating diet. Voiding without difficulty. Ambulating independently. Her abdomen was soft and minimally tender and the wounds looked good. She was given instructions in diet and activities. She was given a prescription for Norco. It was not  felt that she needed antibiotics. Followup in the office in 3 weeks.  Consults: None  Significant Diagnostic Studies: radiology: CT scan: abdomen , Surgical pathology pending  Treatments: surgery: Laparoscopic appendectomy  Disposition: Home  Patient Instructions:    Medication List         aspirin 81 MG tablet  Take 81 mg by mouth daily.     bimatoprost 0.01 % Soln  Commonly known as:  LUMIGAN  Place 1 drop into both eyes at bedtime.     CALCIUM 1200+D3 PO  Take 1 tablet by mouth 2 (two) times daily.     DEXILANT 60 MG capsule  Generic drug:  dexlansoprazole  Take 60 mg by mouth daily.     diclofenac 50 MG EC tablet  Commonly known as:  VOLTAREN  Take 50 mg by mouth 2 (two) times daily.     doxycycline 100 MG EC tablet  Commonly known as:  DORYX  Take 100 mg by mouth 2 (two) times daily.     ENTERAGAM 5 G Pack  Generic drug:  SBI/Protein Isolate  Take 5 mg by mouth at bedtime.     ezetimibe-simvastatin 10-40 MG per tablet  Commonly known as:  VYTORIN  Take 1 tablet by mouth daily.     FINACEA 15 % cream  Generic drug:  Azelaic Acid  Apply 1 application topically daily. After skin is thoroughly washed and patted dry, gently but thoroughly massage a thin film of azelaic acid cream into the affected area twice daily, in the morning and evening.     fluocinolone 0.01 % cream  Commonly known as:  VANOS  Apply 1 application topically daily as needed (psoriasis).     furosemide  20 MG tablet  Commonly known as:  LASIX  Take 20 mg by mouth daily.     glucosamine-chondroitin 500-400 MG tablet  Take 1 tablet by mouth 2 (two) times daily.     HYDROcodone-acetaminophen 5-325 MG per tablet  Commonly known as:  NORCO  Take 1-2 tablets by mouth every 6 (six) hours as needed.     levocetirizine 5 MG tablet  Commonly known as:  XYZAL  Take 5 mg by mouth every evening.     olmesartan 40 MG tablet  Commonly known as:  BENICAR  Take 40 mg by mouth daily.      omega-3 acid ethyl esters 1 G capsule  Commonly known as:  LOVAZA  Take 2 g by mouth 2 (two) times daily.     raloxifene 60 MG tablet  Commonly known as:  EVISTA  Take 60 mg by mouth daily.     tretinoin 0.05 % cream  Commonly known as:  RETIN-A  Apply 1 application topically at bedtime.        Activity: activity as tolerated Diet: regular diet Wound Care: none needed  Follow-up:  With Dr. Hulen Skains in 3 weeks.  Signed: Edsel Petrin. Dalbert Batman, M.D., FACS General and minimally invasive surgery Breast and Colorectal Surgery  06/16/2013, 9:04 AM

## 2013-06-19 ENCOUNTER — Encounter (HOSPITAL_COMMUNITY): Payer: Self-pay | Admitting: General Surgery

## 2013-07-11 ENCOUNTER — Ambulatory Visit (INDEPENDENT_AMBULATORY_CARE_PROVIDER_SITE_OTHER): Payer: Medicare Other | Admitting: General Surgery

## 2013-07-11 ENCOUNTER — Encounter (INDEPENDENT_AMBULATORY_CARE_PROVIDER_SITE_OTHER): Payer: Self-pay | Admitting: General Surgery

## 2013-07-11 VITALS — BP 124/78 | HR 76 | Temp 97.6°F | Ht 61.0 in | Wt 163.0 lb

## 2013-07-11 DIAGNOSIS — Z09 Encounter for follow-up examination after completed treatment for conditions other than malignant neoplasm: Secondary | ICD-10-CM

## 2013-07-11 NOTE — Progress Notes (Signed)
Subjective:     Patient ID: Alicia Rhodes, female   DOB: 1935/10/02, 78 y.o.   MRN: [id number]  HPI The patient is doing very well status post laparoscopic cholecystectomy.  Review of Systems No fevers, chills, nausea, or vomiting.    Objective:   Physical Exam Her postoperative incisions are healing well no evidence of infection. She has no abdominal tenderness. No palpable masses.    Assessment:     Doing well status post laparoscopic appendectomy.     Plan:     The patient is released to full activity and does not need to return to see me in clinic.

## 2014-08-07 ENCOUNTER — Other Ambulatory Visit: Payer: Self-pay | Admitting: Physician Assistant

## 2015-05-29 ENCOUNTER — Other Ambulatory Visit: Payer: Self-pay | Admitting: Physician Assistant

## 2015-07-17 ENCOUNTER — Other Ambulatory Visit: Payer: Self-pay | Admitting: Urology

## 2015-08-28 NOTE — Patient Instructions (Signed)
Alicia Rhodes  08/28/2015   Your procedure is scheduled on: 09/17/2015    Report to [facility]  Entrance take [name]  elevators to 3rd floor to  [name] at   [name].  Call this number if you have problems the morning of surgery [phone]   Remember: ONLY 1 PERSON MAY GO WITH YOU TO SHORT STAY TO GET  READY MORNING OF [name].  Do not eat food or drink liquids :After Midnight.     Take these medicines the morning of surgery with A SIP OF WATER: Dexilant                                 You may not have any metal on your body including hair pins and              piercings  Do not wear jewelry, make-up, lotions, powders or perfumes, deodorant             Do not wear nail polish.  Do not shave  48 hours prior to surgery.                Do not bring valuables to the hospital. [name].  Contacts, dentures or bridgework may not be worn into surgery.      Patients discharged the day of surgery will not be allowed to drive home.  Name and phone number of your driver:  Special Instructions:coughing and deep breathing exercises, leg exercises               Please read over the following fact sheets you were given: _____________________________________________________________________             [name] - Preparing for Surgery Before surgery, you can play an important role.  Because skin is not sterile, your skin needs to be as free of germs as possible.  You can reduce the number of germs on your skin by washing with CHG (chlorahexidine gluconate) soap before surgery.  CHG is an antiseptic cleaner which kills germs and bonds with the skin to continue killing germs even after washing. Please DO NOT use if you have an allergy to CHG or antibacterial soaps.  If your skin becomes reddened/irritated stop using the CHG and inform your nurse when you arrive at Short Stay. Do not shave (including  legs and underarms) for at least 48 hours prior to the first CHG shower.  You may shave your face/neck. Please follow these instructions carefully:  1.  Shower with CHG Soap the night before surgery and the  morning of Surgery.  2.  If you choose to wash your hair, wash your hair first as usual with your  normal  shampoo.  3.  After you shampoo, rinse your hair and body thoroughly to remove the  shampoo.                           4.  Use CHG as you would any other liquid soap.  You can apply chg directly  to the skin and wash  Gently with a scrungie or clean washcloth.  5.  Apply the CHG Soap to your body ONLY FROM THE NECK DOWN.   Do not use on face/ open                           Wound or open sores. Avoid contact with eyes, ears mouth and genitals (private parts).                       Wash face,  Genitals (private parts) with your normal soap.             6.  Wash thoroughly, paying special attention to the area where your surgery  will be performed.  7.  Thoroughly rinse your body with warm water from the neck down.  8.  DO NOT shower/wash with your normal soap after using and rinsing off  the CHG Soap.                9.  Pat yourself dry with a clean towel.            10.  Wear clean pajamas.            11.  Place clean sheets on your bed the night of your first shower and do not  sleep with pets. Day of Surgery : Do not apply any lotions/deodorants the morning of surgery.  Please wear clean clothes to the hospital/surgery center.  FAILURE TO FOLLOW THESE INSTRUCTIONS MAY RESULT IN THE CANCELLATION OF YOUR SURGERY PATIENT SIGNATURE_________________________________  NURSE SIGNATURE__________________________________  ________________________________________________________________________  WHAT IS A BLOOD TRANSFUSION? Blood Transfusion Information  A transfusion is the replacement of blood or some of its parts. Blood is made up of multiple cells which provide  different functions.  Red blood cells carry oxygen and are used for blood loss replacement.  White blood cells fight against infection.  Platelets control bleeding.  Plasma helps clot blood.  Other blood products are available for specialized needs, such as hemophilia or other clotting disorders. BEFORE THE TRANSFUSION  Who gives blood for transfusions?   Healthy volunteers who are fully evaluated to make sure their blood is safe. This is blood bank blood. Transfusion therapy is the safest it has ever been in the practice of medicine. Before blood is taken from a donor, a complete history is taken to make sure that person has no history of diseases nor engages in risky social behavior (examples are intravenous drug use or sexual activity with multiple partners). The donor's travel history is screened to minimize risk of transmitting infections, such as malaria. The donated blood is tested for signs of infectious diseases, such as HIV and hepatitis. The blood is then tested to be sure it is compatible with you in order to minimize the chance of a transfusion reaction. If you or a relative donates blood, this is often done in anticipation of surgery and is not appropriate for emergency situations. It takes many days to process the donated blood. RISKS AND COMPLICATIONS Although transfusion therapy is very safe and saves many lives, the main dangers of transfusion include:   Getting an infectious disease.  Developing a transfusion reaction. This is an allergic reaction to something in the blood you were given. Every precaution is taken to prevent this. The decision to have a blood transfusion has been considered carefully by your caregiver before blood is given. Blood is not given unless the benefits outweigh  the risks. AFTER THE TRANSFUSION  Right after receiving a blood transfusion, you will usually feel much better and more energetic. This is especially true if your red blood cells have  gotten low (anemic). The transfusion raises the level of the red blood cells which carry oxygen, and this usually causes an energy increase.  The nurse administering the transfusion will monitor you carefully for complications. HOME CARE INSTRUCTIONS  No special instructions are needed after a transfusion. You may find your energy is better. Speak with your caregiver about any limitations on activity for underlying diseases you may have. SEEK MEDICAL CARE IF:   Your condition is not improving after your transfusion.  You develop redness or irritation at the intravenous (IV) site. SEEK IMMEDIATE MEDICAL CARE IF:  Any of the following symptoms occur over the next 12 hours:  Shaking chills.  You have a temperature by mouth above 102 F (38.9 C), not controlled by medicine.  Chest, back, or muscle pain.  People around you feel you are not acting correctly or are confused.  Shortness of breath or difficulty breathing.  Dizziness and fainting.  You get a rash or develop hives.  You have a decrease in urine output.  Your urine turns a dark color or changes to pink, red, or brown. Any of the following symptoms occur over the next 10 days:  You have a temperature by mouth above 102 F (38.9 C), not controlled by medicine.  Shortness of breath.  Weakness after normal activity.  The white part of the eye turns yellow (jaundice).  You have a decrease in the amount of urine or are urinating less often.  Your urine turns a dark color or changes to pink, red, or brown. Document Released: 01/03/2000 Document Revised: 03/30/2011 Document Reviewed: 08/22/2007 [facility] Patient Information 2014 [place], [place].  _______________________________________________________________________

## 2015-08-30 ENCOUNTER — Encounter (HOSPITAL_COMMUNITY)
Admission: RE | Admit: 2015-08-30 | Discharge: 2015-08-30 | Disposition: A | Discharge status: Home / Self Care | Payer: Medicare Other | Source: Ambulatory Visit | Attending: Urology | Admitting: Urology

## 2015-08-30 ENCOUNTER — Encounter (HOSPITAL_COMMUNITY): Payer: Self-pay

## 2015-08-30 DIAGNOSIS — Z01810 Encounter for preprocedural cardiovascular examination: Secondary | ICD-10-CM | POA: Insufficient documentation

## 2015-08-30 DIAGNOSIS — Z85828 Personal history of other malignant neoplasm of skin: Secondary | ICD-10-CM | POA: Diagnosis not present

## 2015-08-30 DIAGNOSIS — K219 Gastro-esophageal reflux disease without esophagitis: Secondary | ICD-10-CM | POA: Insufficient documentation

## 2015-08-30 DIAGNOSIS — I10 Essential (primary) hypertension: Secondary | ICD-10-CM | POA: Diagnosis not present

## 2015-08-30 DIAGNOSIS — Z01812 Encounter for preprocedural laboratory examination: Secondary | ICD-10-CM | POA: Diagnosis present

## 2015-08-30 HISTORY — DX: Malignant (primary) neoplasm, unspecified: C80.1

## 2015-08-30 HISTORY — DX: Gastro-esophageal reflux disease without esophagitis: K21.9

## 2015-08-30 HISTORY — DX: Unspecified osteoarthritis, unspecified site: M19.90

## 2015-08-30 LAB — CBC
HCT: 41.6 % (ref 36.0–46.0)
Hemoglobin: 14.6 g/dL (ref 12.0–15.0)
MCH: 32.4 pg (ref 26.0–34.0)
MCHC: 35.1 g/dL (ref 30.0–36.0)
MCV: 92.2 fL (ref 78.0–100.0)
PLATELETS: 206 10*3/uL (ref 150–400)
RBC: 4.51 MIL/uL (ref 3.87–5.11)
RDW: 13.7 % (ref 11.5–15.5)
WBC: 4.5 10*3/uL (ref 4.0–10.5)

## 2015-08-30 LAB — BASIC METABOLIC PANEL
Anion gap: 7 (ref 5–15)
BUN: 19 mg/dL (ref 6–20)
CHLORIDE: 104 mmol/L (ref 101–111)
CO2: 27 mmol/L (ref 22–32)
CREATININE: 0.77 mg/dL (ref 0.44–1.00)
Calcium: 9.5 mg/dL (ref 8.9–10.3)
GFR calc Af Amer: >60 mL/min (ref >60)
GFR calc non Af Amer: >60 mL/min (ref >60)
Glucose, Bld: 90 mg/dL (ref 65–99)
Potassium: 4.2 mmol/L (ref 3.5–5.1)
SODIUM: 138 mmol/L (ref 135–145)

## 2015-08-30 LAB — APTT: APTT: 33 s (ref 24–36)

## 2015-08-30 LAB — PROTIME-INR
INR: 0.98
Prothrombin Time: 13 seconds (ref 11.4–15.2)

## 2015-09-04 NOTE — Progress Notes (Signed)
EKG- 03/26/15- on chart

## 2015-09-16 MED ORDER — GENTAMICIN SULFATE 40 MG/ML IJ SOLN
360.0000 mg | INTRAVENOUS | Status: AC
  Administered 2015-09-17: 360 mg via INTRAVENOUS
  Filled 2015-09-16: qty 9

## 2015-09-16 NOTE — H&P (Signed)
CC/HPI: I was consulted by Dr Mellody Drown regarding Alicia Rhodes's pelvic organ prolapse and urinary incontinence. It has been stable over a few years. She leaks with coughing and sneezing and sometimes bending and lifting. She has more significant urge incontinence. She can soak a pad first thing in the morning with foot-on-the-floor syndrome. She has no enuresis. She wears 2 pads a day.   Alicia Nute is here to discuss her urodynamics. She had a large posterior defect and her vaginal cuff descended approximately 4 cm. She had a fixed bladder neck and at best a grade 1 cystocele. Her urge incontinence is more significant than her stress incontinence. Her initial residual was 189 mL. I thought if she ever had surgery, she would likely best benefit from a rectocele repair and a vault suspension and intraoperative inspection for an enterocele.   the patient did have a large capacity bladder of 833 mL. The bladder was stable. She did have a moderate outlet abnormality noted. She did not generate a strong detrusor contraction   I drew Alicia Migliore a picture. I am not surprised that she has actually had a Raz bladder suspension years ago I believe by Dr Gaynelle Arabian. She may truly not be emptying well. She understands that before she consents to a posterior repair, we really need to sort out her ability to empty. I am going to see her for her mixed incontinence on Myrbetriq in 1 month with a flow and residual. If she still has a tendency not to empty well, I do not think the outlet should be addressed at the time of surgery, or if it was one would want to consider something like Macroplastique.   the patient today had a flow and residual and once again had an elevated residual urine volume for the third or fourth time in was 227 mL     ALLERGIES: Penicillins    MEDICATIONS: Aspirin TABS Oral  Calcium + D TABS Oral  Colace CAPS Oral  Dexilant 60 MG Oral Capsule Delayed Release Oral  Doxycycline Monohydrate TABS Oral   Finacea 15 % External Foam External  Furosemide TABS Oral  Glucosamine CAPS Oral  Lovaza CAPS Oral  Lumigan 0.01 % Ophthalmic Solution Ophthalmic  Retin-A 0.1 % External Cream External  Vytorin 10-40 MG Oral Tablet Oral  Xyzal 5 MG Oral Tablet Oral     GU PSH: Hysterectomy Unilat SO - 05/07/2015      PSH Notes: Hysterectomy, Appendectomy, Bladder Surgery   NON-GU PSH: Appendectomy - 05/07/2015    GU PMH: Mixed incontinence, Urge and stress incontinence - 06/04/2015 Rectocele, Rectocele, female - 06/04/2015 Nocturia, Nocturia - 05/07/2015 Personal Hx Malig Neo Unspec, History of malignant neoplasm - 05/07/2015    NON-GU PMH: Encounter for general adult medical examination without abnormal findings, Encounter for preventive health examination - 05/31/2015 Personal history of other diseases of the circulatory system, History of hypertension - 05/07/2015 Personal history of other diseases of the musculoskeletal system and connective tissue, History of arthritis - 05/07/2015 Personal history of other diseases of the nervous system and sense organs, History of glaucoma - 05/07/2015 Pure hypercholesterolemia, unspecified, High cholesterol - 05/07/2015    FAMILY HISTORY: malignant neoplasm - Runs In Family Strokes - Runs In Family   SOCIAL HISTORY: None    Notes: Has 2 children, Father deceased, Former smoker, Widow, Retired, Mother deceased, Caffeine use   REVIEW OF SYSTEMS:    GU Review Female:   Patient reports leakage of urine. Patient denies frequent urination, hard  to postpone urination, burning /pain with urination, get up at night to urinate, stream starts and stops, trouble starting your stream, have to strain to urinate, and currently pregnant.  Gastrointestinal (Upper):   Patient denies nausea, vomiting, and indigestion/ heartburn.  Gastrointestinal (Lower):   Patient denies diarrhea and constipation.  Constitutional:   Patient denies fever, night sweats, weight loss, and fatigue.   Skin:   Patient denies skin rash/ lesion and itching.  Eyes:   Patient denies blurred vision and double vision.  Ears/ Nose/ Throat:   Patient denies sore throat and sinus problems.  Hematologic/Lymphatic:   Patient denies swollen glands and easy bruising.  Cardiovascular:   Patient denies leg swelling and chest pains.  Respiratory:   Patient denies cough and shortness of breath.  Endocrine:   Patient denies excessive thirst.  Musculoskeletal:   Patient denies back pain and joint pain.  Neurological:   Patient denies headaches and dizziness.  Psychologic:   Patient denies depression and anxiety.   VITAL SIGNS: None   PAST DATA REVIEWED:  Source Of History:  Patient   PROCEDURES:            Flow Rate - 51741  Voided Volume: 153 cc  Peak Flow Rate: 14 cc/sec           PVR Ultrasound - 51798  Scanned Volume: 227 cc         Urinalysis w/Scope - 81001 Dipstick Dipstick Cont'd Micro  Specimen: Voided Blood: Trace Intact WBC/hpf: 6-10/hpf  Color: Yellow Nitrites: Neg RBC/hpf: 0-2/hpf  Appearance: Cloudy Leukocyte Esterase: 2+ Bacteria: NS (Not Seen)    ASSESSMENT:         Notes:   I drew the patient a picture. We talked about watchful waiting and natural history Versed pessary first transvaginal vault suspension and posterior repair and graft and repair of enterocele if present   I drew her a picture and we talked about prolapse surgery in detail. Pros, cons, general surgical and anesthetic risks, and other options including behavioral therapy, pessaries, and watchful waiting were discussed. Natural history of prolapse was discussed. She understands that prolapse repairs are successful in 80-85% of cases for prolapse symptoms and can recur anteriorly, posteriorly, and/or apically. She understands that in most cases I use a graft and general risks were discussed. Surgical risks were described but not limited to the discussion of injury to neighboring structures including the bowel  (with possible life-threatening sepsis and colostomy), bladder, urethra, vagina (all resulting in further surgery), and ureter (resulting in re-implantation). We talked about injury to nerves/soft tissue leading to debilitating and intractable pelvic, abdominal, and lower extremity pain syndromes and neuropathies. The risks of buttock pain, intractable dyspareunia, and vaginal narrowing and shortening with sequelae were discussed. Bleeding risks, transfusion rates, and infection were discussed. The risk of persistent, de novo, or worsening bladder and/or bowel incontinence/dysfunction was discussed. The need for CIC was described as well the usual post-operative course. The patient understands that she might not reach her treatment goal and that she might be worse following surgery.   I strongly urged the patient not to have any L lip procedure because of her incomplete bladder emptying and that she actually was doing quite well on the beta 3 agonists. She is wearing to light pads a day with greatly improved nighttime frequency daytime frequency and incontinence   Mesh issues described rare risk of incontinence described   After a thorough review of the management options for the patient's condition the  patient  elected to proceed with surgical therapy as noted above. We have discussed the potential benefits and risks of the procedure, side effects of the proposed treatment, the likelihood of the patient achieving the goals of the procedure, and any potential problems that might occur during the procedure or recuperation. Informed consent has been obtained.

## 2015-09-17 ENCOUNTER — Ambulatory Visit (HOSPITAL_COMMUNITY): Payer: Medicare Other | Admitting: Registered Nurse

## 2015-09-17 ENCOUNTER — Observation Stay (HOSPITAL_COMMUNITY)
Admission: RE | Admit: 2015-09-17 | Discharge: 2015-09-18 | Disposition: A | Discharge status: Home / Self Care | Payer: Medicare Other | Source: Ambulatory Visit | Attending: Urology | Admitting: Urology

## 2015-09-17 ENCOUNTER — Encounter (HOSPITAL_COMMUNITY): Payer: Self-pay

## 2015-09-17 ENCOUNTER — Encounter (HOSPITAL_COMMUNITY)
Admission: RE | Disposition: A | Discharge status: Home / Self Care | Payer: Self-pay | Source: Ambulatory Visit | Attending: Urology

## 2015-09-17 DIAGNOSIS — K219 Gastro-esophageal reflux disease without esophagitis: Secondary | ICD-10-CM | POA: Insufficient documentation

## 2015-09-17 DIAGNOSIS — E7800 Pure hypercholesterolemia, unspecified: Secondary | ICD-10-CM | POA: Diagnosis not present

## 2015-09-17 DIAGNOSIS — H409 Unspecified glaucoma: Secondary | ICD-10-CM | POA: Diagnosis not present

## 2015-09-17 DIAGNOSIS — I10 Essential (primary) hypertension: Secondary | ICD-10-CM | POA: Diagnosis not present

## 2015-09-17 DIAGNOSIS — Z7982 Long term (current) use of aspirin: Secondary | ICD-10-CM | POA: Diagnosis not present

## 2015-09-17 DIAGNOSIS — N993 Prolapse of vaginal vault after hysterectomy: Principal | ICD-10-CM | POA: Insufficient documentation

## 2015-09-17 DIAGNOSIS — N816 Rectocele: Secondary | ICD-10-CM | POA: Diagnosis present

## 2015-09-17 DIAGNOSIS — Z79899 Other long term (current) drug therapy: Secondary | ICD-10-CM | POA: Diagnosis not present

## 2015-09-17 DIAGNOSIS — Z87891 Personal history of nicotine dependence: Secondary | ICD-10-CM | POA: Insufficient documentation

## 2015-09-17 HISTORY — PX: RECTOCELE REPAIR: SHX761

## 2015-09-17 HISTORY — PX: VAGINAL PROLAPSE REPAIR: SHX830

## 2015-09-17 LAB — HEMOGLOBIN AND HEMATOCRIT, BLOOD
HEMATOCRIT: 40.3 % (ref 36.0–46.0)
HEMOGLOBIN: 14.1 g/dL (ref 12.0–15.0)

## 2015-09-17 SURGERY — COLPORRHAPHY, POSTERIOR, FOR RECTOCELE REPAIR
Anesthesia: General | Site: Vagina

## 2015-09-17 MED ORDER — LIDOCAINE HCL (CARDIAC) 20 MG/ML IV SOLN
INTRAVENOUS | Status: DC | PRN
Start: 2015-09-17 — End: 2015-09-17
  Administered 2015-09-17: 100 mg via INTRAVENOUS

## 2015-09-17 MED ORDER — SODIUM CHLORIDE 0.9 % IR SOLN
Status: DC | PRN
  Administered 2015-09-17: 500 mL

## 2015-09-17 MED ORDER — HYDROCODONE-ACETAMINOPHEN 5-325 MG PO TABS: 1.0000 | ORAL_TABLET | Freq: Four times a day (QID) | ORAL | 0 refills | Status: AC | PRN

## 2015-09-17 MED ORDER — LORATADINE 10 MG PO TABS
10.0000 mg | ORAL_TABLET | Freq: Every evening | ORAL | Status: DC
  Administered 2015-09-17: 10 mg via ORAL
  Filled 2015-09-17: qty 1

## 2015-09-17 MED ORDER — PROPOFOL 10 MG/ML IV BOLUS
INTRAVENOUS | Status: DC | PRN
  Administered 2015-09-17: 150 mg via INTRAVENOUS

## 2015-09-17 MED ORDER — FENTANYL CITRATE (PF) 100 MCG/2ML IJ SOLN: 25.0000 ug | INTRAMUSCULAR | Status: DC | PRN

## 2015-09-17 MED ORDER — EPHEDRINE 5 MG/ML INJ
INTRAVENOUS | Status: AC
  Filled 2015-09-17: qty 10

## 2015-09-17 MED ORDER — LIDOCAINE-EPINEPHRINE (PF) 1 %-1:200000 IJ SOLN
INTRAMUSCULAR | Status: DC | PRN
  Administered 2015-09-17: 26 mL

## 2015-09-17 MED ORDER — ESTRADIOL 0.1 MG/GM VA CREA
TOPICAL_CREAM | VAGINAL | Status: AC
  Filled 2015-09-17: qty 85

## 2015-09-17 MED ORDER — EZETIMIBE-SIMVASTATIN 10-40 MG PO TABS
1.0000 | ORAL_TABLET | Freq: Every day | ORAL | Status: DC
  Administered 2015-09-17: 1 via ORAL
  Filled 2015-09-17 (×2): qty 1

## 2015-09-17 MED ORDER — LIDOCAINE-EPINEPHRINE (PF) 1 %-1:200000 IJ SOLN
INTRAMUSCULAR | Status: AC
  Filled 2015-09-17: qty 60

## 2015-09-17 MED ORDER — STERILE WATER FOR IRRIGATION IR SOLN
Status: DC | PRN
  Administered 2015-09-17: 1000 mL

## 2015-09-17 MED ORDER — DIPHENHYDRAMINE HCL 12.5 MG/5ML PO ELIX: 12.5000 mg | ORAL_SOLUTION | Freq: Four times a day (QID) | ORAL | Status: DC | PRN

## 2015-09-17 MED ORDER — CLINDAMYCIN PHOSPHATE 900 MG/50ML IV SOLN
900.0000 mg | Freq: Once | INTRAVENOUS | Status: AC
  Administered 2015-09-17: 900 mg via INTRAVENOUS

## 2015-09-17 MED ORDER — FENTANYL CITRATE (PF) 100 MCG/2ML IJ SOLN
INTRAMUSCULAR | Status: AC
  Filled 2015-09-17: qty 2

## 2015-09-17 MED ORDER — LACTATED RINGERS IV SOLN
INTRAVENOUS | Status: DC | PRN
  Administered 2015-09-17: 07:00:00 via INTRAVENOUS

## 2015-09-17 MED ORDER — ONDANSETRON HCL 4 MG/2ML IJ SOLN: 4.0000 mg | INTRAMUSCULAR | Status: DC | PRN

## 2015-09-17 MED ORDER — DIPHENHYDRAMINE HCL 50 MG/ML IJ SOLN: 12.5000 mg | Freq: Four times a day (QID) | INTRAMUSCULAR | Status: DC | PRN

## 2015-09-17 MED ORDER — DEXTROSE-NACL 5-0.45 % IV SOLN
INTRAVENOUS | Status: DC
  Administered 2015-09-17: 11:00:00 via INTRAVENOUS

## 2015-09-17 MED ORDER — PROMETHAZINE HCL 25 MG/ML IJ SOLN: 6.2500 mg | INTRAMUSCULAR | Status: DC | PRN

## 2015-09-17 MED ORDER — ONDANSETRON HCL 4 MG/2ML IJ SOLN
INTRAMUSCULAR | Status: AC
  Filled 2015-09-17: qty 2

## 2015-09-17 MED ORDER — PANTOPRAZOLE SODIUM 40 MG PO TBEC
40.0000 mg | DELAYED_RELEASE_TABLET | Freq: Every day | ORAL | Status: DC
  Administered 2015-09-18: 40 mg via ORAL
  Filled 2015-09-17: qty 1

## 2015-09-17 MED ORDER — DOCUSATE SODIUM 100 MG PO CAPS
100.0000 mg | ORAL_CAPSULE | Freq: Every day | ORAL | Status: DC
  Administered 2015-09-17 – 2015-09-18 (×2): 100 mg via ORAL
  Filled 2015-09-17 (×2): qty 1

## 2015-09-17 MED ORDER — 0.9 % SODIUM CHLORIDE (POUR BTL) OPTIME
TOPICAL | Status: DC | PRN
  Administered 2015-09-17: 2000 mL

## 2015-09-17 MED ORDER — SUGAMMADEX SODIUM 200 MG/2ML IV SOLN
INTRAVENOUS | Status: AC
  Filled 2015-09-17: qty 2

## 2015-09-17 MED ORDER — ARTIFICIAL TEARS OP OINT
TOPICAL_OINTMENT | OPHTHALMIC | Status: AC
  Filled 2015-09-17: qty 3.5

## 2015-09-17 MED ORDER — CLINDAMYCIN PHOSPHATE 900 MG/50ML IV SOLN
INTRAVENOUS | Status: AC
  Filled 2015-09-17: qty 50

## 2015-09-17 MED ORDER — ACETAMINOPHEN 325 MG PO TABS
650.0000 mg | ORAL_TABLET | ORAL | Status: DC | PRN
  Administered 2015-09-17 – 2015-09-18 (×2): 650 mg via ORAL
  Filled 2015-09-17 (×2): qty 2

## 2015-09-17 MED ORDER — PROPOFOL 10 MG/ML IV BOLUS
INTRAVENOUS | Status: AC
  Filled 2015-09-17: qty 20

## 2015-09-17 MED ORDER — IRBESARTAN 150 MG PO TABS
300.0000 mg | ORAL_TABLET | Freq: Every day | ORAL | Status: DC
  Administered 2015-09-17 – 2015-09-18 (×2): 300 mg via ORAL
  Filled 2015-09-17 (×2): qty 2

## 2015-09-17 MED ORDER — EPHEDRINE SULFATE 50 MG/ML IJ SOLN
INTRAMUSCULAR | Status: DC | PRN
  Administered 2015-09-17 (×2): 5 mg via INTRAVENOUS
  Administered 2015-09-17: 10 mg via INTRAVENOUS
  Administered 2015-09-17 (×2): 5 mg via INTRAVENOUS
  Administered 2015-09-17: 10 mg via INTRAVENOUS
  Administered 2015-09-17 (×2): 5 mg via INTRAVENOUS

## 2015-09-17 MED ORDER — LACTATED RINGERS IV SOLN: INTRAVENOUS | Status: DC

## 2015-09-17 MED ORDER — SUGAMMADEX SODIUM 200 MG/2ML IV SOLN
INTRAVENOUS | Status: DC | PRN
  Administered 2015-09-17: 150 mg via INTRAVENOUS

## 2015-09-17 MED ORDER — LATANOPROST 0.005 % OP SOLN
1.0000 [drp] | Freq: Every day | OPHTHALMIC | Status: DC
  Filled 2015-09-17: qty 2.5

## 2015-09-17 MED ORDER — ESTRADIOL 0.1 MG/GM VA CREA
TOPICAL_CREAM | VAGINAL | Status: DC | PRN
  Administered 2015-09-17: 2 via VAGINAL

## 2015-09-17 MED ORDER — PHENAZOPYRIDINE HCL 200 MG PO TABS
200.0000 mg | ORAL_TABLET | Freq: Once | ORAL | Status: AC
  Administered 2015-09-17: 200 mg via ORAL
  Filled 2015-09-17: qty 1

## 2015-09-17 MED ORDER — ROCURONIUM BROMIDE 10 MG/ML (PF) SYRINGE
PREFILLED_SYRINGE | INTRAVENOUS | Status: AC
  Filled 2015-09-17: qty 10

## 2015-09-17 MED ORDER — TIMOLOL MALEATE 0.5 % OP SOLN
1.0000 [drp] | Freq: Two times a day (BID) | OPHTHALMIC | Status: DC
  Administered 2015-09-18: 1 [drp] via OPHTHALMIC
  Filled 2015-09-17: qty 5

## 2015-09-17 MED ORDER — LIDOCAINE 2% (20 MG/ML) 5 ML SYRINGE
INTRAMUSCULAR | Status: AC
  Filled 2015-09-17: qty 5

## 2015-09-17 MED ORDER — FENTANYL CITRATE (PF) 100 MCG/2ML IJ SOLN
INTRAMUSCULAR | Status: DC | PRN
  Administered 2015-09-17 (×2): 50 ug via INTRAVENOUS

## 2015-09-17 MED ORDER — ONDANSETRON HCL 4 MG/2ML IJ SOLN
INTRAMUSCULAR | Status: DC | PRN
  Administered 2015-09-17: 4 mg via INTRAVENOUS

## 2015-09-17 MED ORDER — MORPHINE SULFATE (PF) 2 MG/ML IV SOLN: 2.0000 mg | INTRAVENOUS | Status: DC | PRN

## 2015-09-17 MED ORDER — POLYMYXIN B SULFATE 500000 UNITS IJ SOLR
INTRAMUSCULAR | Status: AC
  Filled 2015-09-17: qty 1

## 2015-09-17 MED ORDER — HYDROCODONE-ACETAMINOPHEN 5-325 MG PO TABS: 1.0000 | ORAL_TABLET | ORAL | Status: DC | PRN

## 2015-09-17 MED ORDER — LEVOCETIRIZINE DIHYDROCHLORIDE 5 MG PO TABS: 5.0000 mg | ORAL_TABLET | Freq: Every evening | ORAL | Status: DC

## 2015-09-17 MED ORDER — ROCURONIUM BROMIDE 100 MG/10ML IV SOLN
INTRAVENOUS | Status: DC | PRN
  Administered 2015-09-17: 15 mg via INTRAVENOUS
  Administered 2015-09-17: 50 mg via INTRAVENOUS
  Administered 2015-09-17: 5 mg via INTRAVENOUS

## 2015-09-17 MED ORDER — FUROSEMIDE 20 MG PO TABS
20.0000 mg | ORAL_TABLET | Freq: Every day | ORAL | Status: DC
  Administered 2015-09-17 – 2015-09-18 (×2): 20 mg via ORAL
  Filled 2015-09-17 (×2): qty 1

## 2015-09-17 SURGICAL SUPPLY — 52 items
ALLOGRAFT TUTOPLAST AXIS 6X12 (Tissue) ×2 IMPLANT
BAG URINE DRAINAGE (UROLOGICAL SUPPLIES) ×4 IMPLANT
BAG URO CATCHER STRL LF (MISCELLANEOUS) IMPLANT
BLADE SURG 15 STRL LF DISP TIS (BLADE) ×2 IMPLANT
BLADE SURG 15 STRL SS (BLADE) ×2
CATH FOLEY 2WAY SLVR  5CC 14FR (CATHETERS) ×2
CATH FOLEY 2WAY SLVR 5CC 14FR (CATHETERS) ×2 IMPLANT
CLOTH BEACON ORANGE TIMEOUT ST (SAFETY) ×4 IMPLANT
COVER MAYO STAND STRL (DRAPES) IMPLANT
COVER SURGICAL LIGHT HANDLE (MISCELLANEOUS) ×4 IMPLANT
DEVICE CAPIO SLIM SINGLE (INSTRUMENTS) ×4 IMPLANT
DRAIN PENROSE 18X1/4 LTX STRL (WOUND CARE) ×4 IMPLANT
DRAPE SHEET LG 3/4 BI-LAMINATE (DRAPES) ×4 IMPLANT
ELECT PENCIL ROCKER SW 15FT (MISCELLANEOUS) ×4 IMPLANT
FLOSEAL 10ML (HEMOSTASIS) ×4 IMPLANT
GAUZE PACKING 2X5 YD STRL (GAUZE/BANDAGES/DRESSINGS) ×8 IMPLANT
GAUZE SPONGE 4X4 16PLY XRAY LF (GAUZE/BANDAGES/DRESSINGS) ×28 IMPLANT
GLOVE BIO SURGEON STRL SZ 6.5 (GLOVE) ×3 IMPLANT
GLOVE BIO SURGEONS STRL SZ 6.5 (GLOVE) ×1
GLOVE BIOGEL M STRL SZ7.5 (GLOVE) ×4 IMPLANT
GLOVE ECLIPSE 8.5 STRL (GLOVE) ×4 IMPLANT
GOWN STRL REUS W/TWL XL LVL3 (GOWN DISPOSABLE) ×4 IMPLANT
HOLDER FOLEY CATH W/STRAP (MISCELLANEOUS) ×4 IMPLANT
IV NS 1000ML (IV SOLUTION)
IV NS 1000ML BAXH (IV SOLUTION) IMPLANT
KIT BASIN OR (CUSTOM PROCEDURE TRAY) ×4 IMPLANT
NEEDLE HYPO 22GX1.5 SAFETY (NEEDLE) ×8 IMPLANT
NEEDLE MAYO 6 CRC TAPER PT (NEEDLE) ×4 IMPLANT
NS IRRIG 1000ML POUR BTL (IV SOLUTION) ×8 IMPLANT
PACK CYSTO (CUSTOM PROCEDURE TRAY) ×4 IMPLANT
PACKING VAGINAL (PACKING) ×8 IMPLANT
PLUG CATH AND CAP STER (CATHETERS) ×4 IMPLANT
RETRACTOR STAY HOOK 5MM (MISCELLANEOUS) ×8 IMPLANT
SHEET LAVH (DRAPES) ×4 IMPLANT
SUT CAPIO ETHIBPND (SUTURE) ×8 IMPLANT
SUT VIC AB 0 CT1 27 (SUTURE) ×2
SUT VIC AB 0 CT1 27XBRD ANTBC (SUTURE) ×2 IMPLANT
SUT VIC AB 2-0 CT1 27 (SUTURE) ×2
SUT VIC AB 2-0 CT1 27XBRD (SUTURE) ×2 IMPLANT
SUT VIC AB 2-0 SH 27 (SUTURE) ×14
SUT VIC AB 2-0 SH 27X BRD (SUTURE) ×14 IMPLANT
SUT VIC AB 3-0 SH 27 (SUTURE) ×6
SUT VIC AB 3-0 SH 27XBRD (SUTURE) ×6 IMPLANT
SUT VICRYL 0 UR6 27IN ABS (SUTURE) ×8 IMPLANT
SYRINGE 10CC LL (SYRINGE) ×4 IMPLANT
TOWEL OR 17X26 10 PK STRL BLUE (TOWEL DISPOSABLE) ×4 IMPLANT
TOWEL OR NON WOVEN STRL DISP B (DISPOSABLE) ×4 IMPLANT
TUBING CONNECTING 10 (TUBING) ×3 IMPLANT
TUBING CONNECTING 10' (TUBING) ×1
TUTOPLAST AXIS 6X12 (Tissue) ×4 IMPLANT
WATER STERILE IRR 1500ML POUR (IV SOLUTION) IMPLANT
YANKAUER SUCT BULB TIP 10FT TU (MISCELLANEOUS) ×4 IMPLANT

## 2015-09-17 NOTE — Anesthesia Preprocedure Evaluation (Addendum)
Anesthesia Evaluation  Patient identified by MRN, date of birth, ID band Patient awake    Reviewed: Allergy & Precautions, NPO status , Patient's Chart, lab work & pertinent test results  Airway Mallampati: II  TM Distance: >3 FB Neck ROM: Full    Dental no notable dental hx.    Pulmonary former smoker,    Pulmonary exam normal breath sounds clear to auscultation       Cardiovascular hypertension, negative cardio ROS Normal cardiovascular exam Rhythm:Regular Rate:Normal     Neuro/Psych negative neurological ROS  negative psych ROS   GI/Hepatic Neg liver ROS, GERD  ,  Endo/Other  negative endocrine ROS  Renal/GU negative Renal ROS  negative genitourinary   Musculoskeletal  (+) Arthritis ,   Abdominal   Peds negative pediatric ROS (+)  Hematology negative hematology ROS (+)   Anesthesia Other Findings   Reproductive/Obstetrics negative OB ROS                             Anesthesia Physical Anesthesia Plan  ASA: II  Anesthesia Plan: General   Post-op Pain Management:    Induction: Intravenous  Airway Management Planned: Oral ETT  Additional Equipment:   Intra-op Plan:   Post-operative Plan: Extubation in OR  Informed Consent: I have reviewed the patients History and Physical, chart, labs and discussed the procedure including the risks, benefits and alternatives for the proposed anesthesia with the patient or authorized representative who has indicated his/her understanding and acceptance.   Dental advisory given  Plan Discussed with: CRNA  Anesthesia Plan Comments:         Anesthesia Quick Evaluation

## 2015-09-17 NOTE — Interval H&P Note (Signed)
History and Physical Interval Note:  09/17/2015 7:08 AM  Alicia Rhodes  has presented today for surgery, with the diagnosis of CYSTOCELE, VAULT PROLAPSE,INTEROCELE  The various methods of treatment have been discussed with the patient and family. After consideration of risks, benefits and other options for treatment, the patient has consented to  Procedure(s): CYSTOSCOPY (N/A) POSTERIOR REPAIR (RECTOCELE) INTEROCELE (N/A) VAULT PROLAPSE AND GRAFT (N/A) as a surgical intervention .  The patient's history has been reviewed, patient examined, no change in status, stable for surgery.  I have reviewed the patient's chart and labs.  Questions were answered to the patient's satisfaction.     Stellah Donovan A

## 2015-09-17 NOTE — Transfer of Care (Signed)
Immediate Anesthesia Transfer of Care Note  Patient: Alicia Rhodes  Procedure(s) Performed: Procedure(s): POSTERIOR REPAIR (RECTOCELE) (N/A) VAULT PROLAPSE AND GRAFT (N/A)  Patient Location: PACU  Anesthesia Type:General  Level of Consciousness: awake, alert , oriented and patient cooperative  Airway & Oxygen Therapy: Patient Spontanous Breathing and Patient connected to face mask oxygen  Post-op Assessment: Report given to RN, Post -op Vital signs reviewed and stable and Patient moving all extremities  Post vital signs: Reviewed and stable  Last Vitals:  Vitals:   09/17/15 0540  BP: 138/66  Pulse: 62  Resp: 18  Temp: 36.6 C    Last Pain:  Vitals:   09/17/15 0540  TempSrc: Oral         Complications: No apparent anesthesia complications

## 2015-09-17 NOTE — Anesthesia Procedure Notes (Signed)
Procedure Name: Intubation Performed by: Carleene Cooper A Pre-anesthesia Checklist: Patient identified, Emergency Drugs available, Suction available, Patient being monitored and Timeout performed Patient Re-evaluated:Patient Re-evaluated prior to inductionOxygen Delivery Method: Circle system utilized Preoxygenation: Pre-oxygenation with 100% oxygen Intubation Type: IV induction Ventilation: Mask ventilation without difficulty Laryngoscope Size: Mac and 3 Grade View: Grade I Tube type: Oral Tube size: 7.5 mm Number of attempts: 1 Airway Equipment and Method: Stylet Placement Confirmation: ETT inserted through vocal cords under direct vision,  positive ETCO2 and breath sounds checked- equal and bilateral Secured at: 22 cm Tube secured with: Tape Dental Injury: Teeth and Oropharynx as per pre-operative assessment  Comments: Intubation by Jaci Standard, EMS student

## 2015-09-17 NOTE — Op Note (Signed)
Preoperative diagnosis: Rectocele; vault prolapse; possible enterocele Postoperative diagnosis: Rectocele and vault prolapse Surgery: Rectocele repair and graft and vault prolapse repair Surgeon: Dr. Nicki Reaper Nahara Dona Asst.: Estill Bamberg dancy  The patient has the above diagnoses and consented to the above procedure. Preoperative antibiotics were given. Extra care was taken with leg positioning to minimize the risk of compartment syndrome and neuropathy and deep vein thrombosis. On initial inspection she had a very large posterior defect filling the vaginal vault with mild weakness of the vaginal cuff descending a few centimeters. She had a lot of shortening of the anterior vaginal wall and a fixed bladder neck. Her tissues were quite atrophied with almost submucosal  bruising anteriorly at rest prior to manipulation  It was difficult to identify the hymenal ring and I placed an Allis clamp bilaterally. I removed a small triangle of perineal skin. She had tremendous lengthening of the posterior vaginal wall because of the size of the posterior defect. I used my usual Allis technique to make a long posterior vaginal wall incision all the way to the cuff which was marked with a 3-0 Vicryl suture. The tissues were very thin. In my opinion the tissue planes in the distal half the vagina were difficult almost like the tissues were inflamed. A lot of very careful sharp dissection had to be utilized. The dissection was easier in the upper third or half of the vagina. I was very pleased with the mobilization to the pelvic sidewall and at the apex and the apex laterally  The patient had an enormous posterior and apical defect with almost no rectovaginal fascia. There was thinning of the rectovaginal fascia distally. I did a digital rectal examination and tried to identify an enterocele. With my finger in the rectum in the upper third of the vagina the bowel wall was such and there was so little fascia that I could see  myself wiggling my finger. If she did have an enterocele the tissue planes were too thin to try to dissect away from the rectal wall. In my opinion she did not have an enterocele  Not only were the tissues thin but the length of the defect was very long almost like she had a low sigmoidocele. I did a 2 layer posterior repair pushing in the bowel in a cephalad direction and towards the floor. I was exceptionally pleased with the 2 layer repair and reduction of the large posterior defect. I did not lose rectal length. I did a rectal examination and there is no narrowing or palpable distortion of the rectum or rectal lumen. There is no rectal injury  Recognizing that the posterior repair was utilizing thin fascia and bowel wall and the tissues were very weak I gently broke through the apex laterally on both sides and gently finger dissected down to the ischial spine bilaterally. Soft tissues were swept medially. A 0 Ethibond with a Capio device delivered the 0 ethibond 1 full finger breath medial to the spines in a straight line between the spines. They were triple checked and in good position. A repeat rectal examination which was done on 4-5 occasions throughout the case once again demonstrated a good rectocele repair and the sutures were in good position with no rectal injury. She had mild oozing from the left side but I thought an application of FloSeal was appropriate. The bleeding had settled down with a small sponge but I used the FloSeal in the described method before applying the graft  A 12 x 6 dermal  graft well-prepared was shaped to 10.6 cm and then in the shape of a trapezoid. It was sewn in place anatomically not under tension. Distally it was applied to the rectovaginal fascia with gentle 2-0 Vicryl sutures in 3 locations.  I trimmed an appropriate amount of the very redundant posterior vaginal mucosa. I took down my retractor and pulled the graft over the apical midline. I sewed it to the apex  of the vaginal wall mucosa with 2 separate 2-0 Vicryl sutures and I was very pleased with this fixation point not under tension  I closed the posterior vaginal wall with running 2-0 Vicryl on a CT1 needle. It was exteriorized at the introitus. I did a gentle 0 Vicryl perineal repair. I used a 3-0 Vicryl at the level of the hymenal ring to reapproximate some vaginal mucosa which had split a little bit from retraction. I closed the perineal skin with subcuticular suture.  She had excellent length and no narrowing at the end of the case. Blood loss was approximately 100 mL. Leg position was excellent. Her urine output throughout the case was likely almost a liter. The urine was clear  This surgery went very well and my concern is the weakness of her tissues especially at the posterior apex. Hopefully the operation will reach her treatment goal. A sacral colpopexy would not have been an ideal operation for this case

## 2015-09-17 NOTE — Anesthesia Postprocedure Evaluation (Signed)
Anesthesia Post Note  Patient: JERELYN PASK  Procedure(s) Performed: Procedure(s) (LRB): POSTERIOR REPAIR (RECTOCELE) (N/A) VAULT PROLAPSE AND GRAFT (N/A)  Patient location during evaluation: PACU Anesthesia Type: General Level of consciousness: awake and alert Pain management: pain level controlled Vital Signs Assessment: post-procedure vital signs reviewed and stable Respiratory status: spontaneous breathing, nonlabored ventilation, respiratory function stable and patient connected to nasal cannula oxygen Cardiovascular status: blood pressure returned to baseline and stable Postop Assessment: no signs of nausea or vomiting Anesthetic complications: no    Last Vitals:  Vitals:   09/17/15 1130 09/17/15 1145  BP: 107/67 (!) 119/45  Pulse: 65 66  Resp: 20 16  Temp: 36.4 C 36.4 C    Last Pain:  Vitals:   09/17/15 1152  TempSrc:   PainSc: 0-No pain                 Yassmine Tamm J

## 2015-09-18 DIAGNOSIS — N993 Prolapse of vaginal vault after hysterectomy: Secondary | ICD-10-CM | POA: Diagnosis not present

## 2015-09-18 LAB — HEMOGLOBIN AND HEMATOCRIT, BLOOD
HCT: 35.5 % — ABNORMAL LOW (ref 36.0–46.0)
HEMOGLOBIN: 12.5 g/dL (ref 12.0–15.0)

## 2015-09-18 LAB — BASIC METABOLIC PANEL
ANION GAP: 5 (ref 5–15)
BUN: 16 mg/dL (ref 6–20)
CO2: 29 mmol/L (ref 22–32)
Calcium: 8.3 mg/dL — ABNORMAL LOW (ref 8.9–10.3)
Chloride: 106 mmol/L (ref 101–111)
Creatinine, Ser: 0.78 mg/dL (ref 0.44–1.00)
Glucose, Bld: 115 mg/dL — ABNORMAL HIGH (ref 65–99)
POTASSIUM: 3.8 mmol/L (ref 3.5–5.1)
SODIUM: 140 mmol/L (ref 135–145)

## 2015-09-18 NOTE — Progress Notes (Signed)
Looks great No pain Vitals normal Labs OK Instructions detailed

## 2015-09-18 NOTE — Progress Notes (Addendum)
Foley was discontinued this am, Voiding trials started. Pt has voided a total of 300 cc. Bladder scan completed, PVD 150 cc. Will continue to encouraged pt to void. Spoke to urology provider, ok to proceed with discharge.

## 2015-09-18 NOTE — Discharge Instructions (Signed)
I have reviewed discharge instructions in detail with the patient. They will follow-up with me or their physician as scheduled. My nurse will also be calling the patients as per protocol. As discussed with Dr. Annabell Oconnor. ° °You may resume aspirin, advil, aleve, vitamins, and supplements 7 days after surgery. °

## 2015-09-18 NOTE — Discharge Summary (Signed)
Date of admission: 09/17/2015  Date of discharge: 09/18/2015  Admission diagnosis: Rectocele  Discharge diagnosis: Rectocele  Secondary diagnoses: Vault prolapse  History and Physical: For full details, please see admission history and physical. Briefly, Alicia Rhodes is a 80 y.o. year old patient with the above diagnosis.   Hospital Course: Prolapse surgery went very well. Weak tissue noted. Uneventful post op course  Laboratory values:  Recent Labs  09/17/15 1602 09/18/15 0535  HGB 14.1 12.5  HCT 40.3 35.5*    Recent Labs  09/18/15 0535  CREATININE 0.78    Disposition: Home  Discharge instruction: The patient was instructed to be ambulatory but told to refrain from heavy lifting, strenuous activity, or driving. Detailed  Discharge medications:    Medication List    STOP taking these medications   aspirin 81 MG tablet   CALCIUM 1200+D3 PO   glucosamine-chondroitin 500-400 MG tablet     TAKE these medications   DEXILANT 60 MG capsule Generic drug:  dexlansoprazole Take 60 mg by mouth daily.   docusate sodium 100 MG capsule Commonly known as:  COLACE Take 100 mg by mouth daily.   doxycycline 150 MG EC tablet Commonly known as:  DORYX Take 150 mg by mouth 2 (two) times daily.   ezetimibe-simvastatin 10-40 MG tablet Commonly known as:  VYTORIN Take 1 tablet by mouth daily.   FINACEA 15 % cream Generic drug:  Azelaic Acid Apply 1 application topically daily. After skin is thoroughly washed and patted dry, gently but thoroughly massage a thin film of azelaic acid cream into the affected area twice daily, in the morning and evening.   fluocinolone 0.01 % cream Commonly known as:  VANOS Apply 1 application topically daily as needed (psoriasis).   furosemide 20 MG tablet Commonly known as:  LASIX Take 20 mg by mouth daily.   HYDROcodone-acetaminophen 5-325 MG tablet Commonly known as:  NORCO Take 1-2 tablets by mouth every 6 (six) hours as needed for  moderate pain.   levocetirizine 5 MG tablet Commonly known as:  XYZAL Take 5 mg by mouth every evening.   MIRVASO EX Apply 1 application topically daily.   olmesartan 40 MG tablet Commonly known as:  BENICAR Take 40 mg by mouth daily.   omega-3 acid ethyl esters 1 g capsule Commonly known as:  LOVAZA Take 2 g by mouth 2 (two) times daily.   timolol 0.5 % ophthalmic solution Commonly known as:  BETIMOL Place 1 drop into both eyes 2 (two) times daily.   tretinoin 0.05 % cream Commonly known as:  RETIN-A Apply 1 application topically at bedtime.       Followup:  Follow-up Information    Jalani Rominger A, MD.   Specialty:  Urology Why:  office will call you with date and time of appointment Contact information: [address] [place] [address] [phone]

## 2015-09-18 NOTE — Progress Notes (Signed)
foley catheter and vaginal packing. Patient tolerated well.

## 2016-11-27 ENCOUNTER — Other Ambulatory Visit: Payer: Self-pay | Admitting: Physician Assistant

## 2017-04-02 ENCOUNTER — Other Ambulatory Visit: Payer: Self-pay | Admitting: Internal Medicine

## 2017-04-02 ENCOUNTER — Ambulatory Visit
Admission: RE | Admit: 2017-04-02 | Discharge: 2017-04-02 | Disposition: A | Discharge status: Home / Self Care | Payer: Medicare Other | Source: Ambulatory Visit | Attending: Internal Medicine | Admitting: Internal Medicine

## 2017-04-02 DIAGNOSIS — I10 Essential (primary) hypertension: Secondary | ICD-10-CM

## 2018-12-12 IMAGING — CR DG CHEST 2V
2 series · 2 of 2 positions shown · non-contrast
Comparison: 06/15/2013

CLINICAL DATA: Hypertension, unspecified

EXAM:
CHEST - 2 VIEW

[w chest pa]
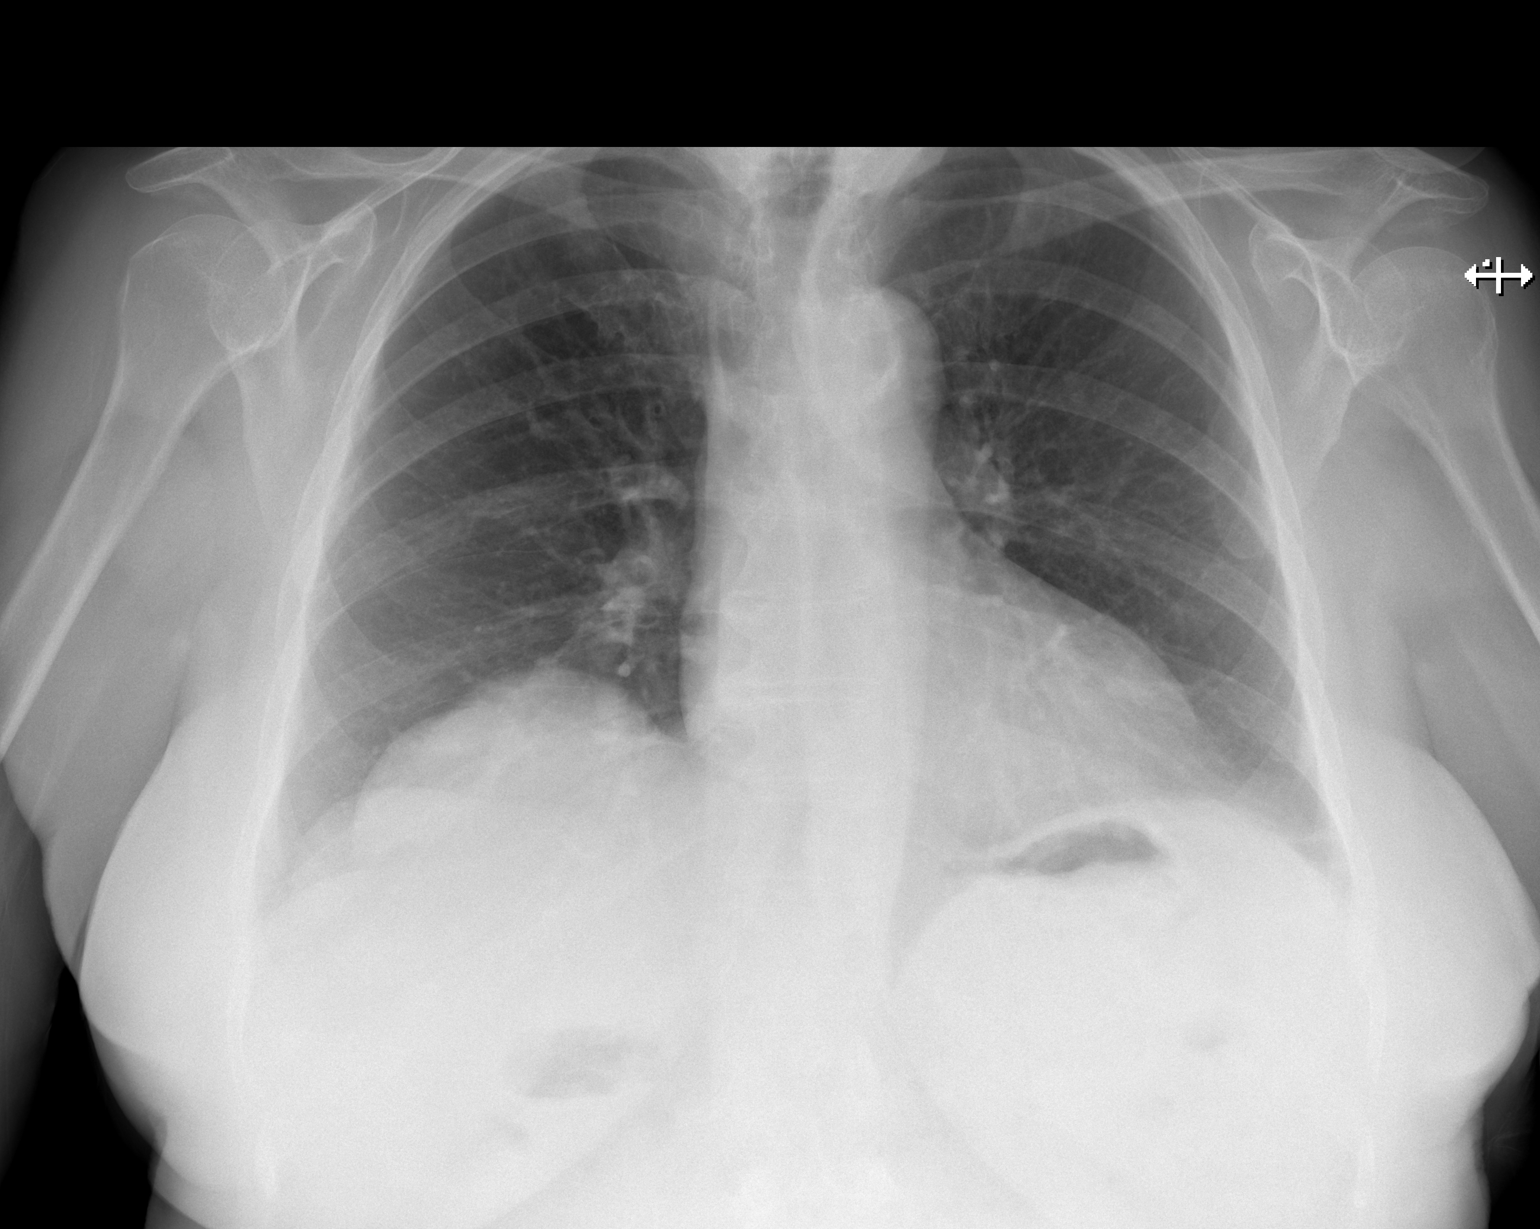

[w chest lat]
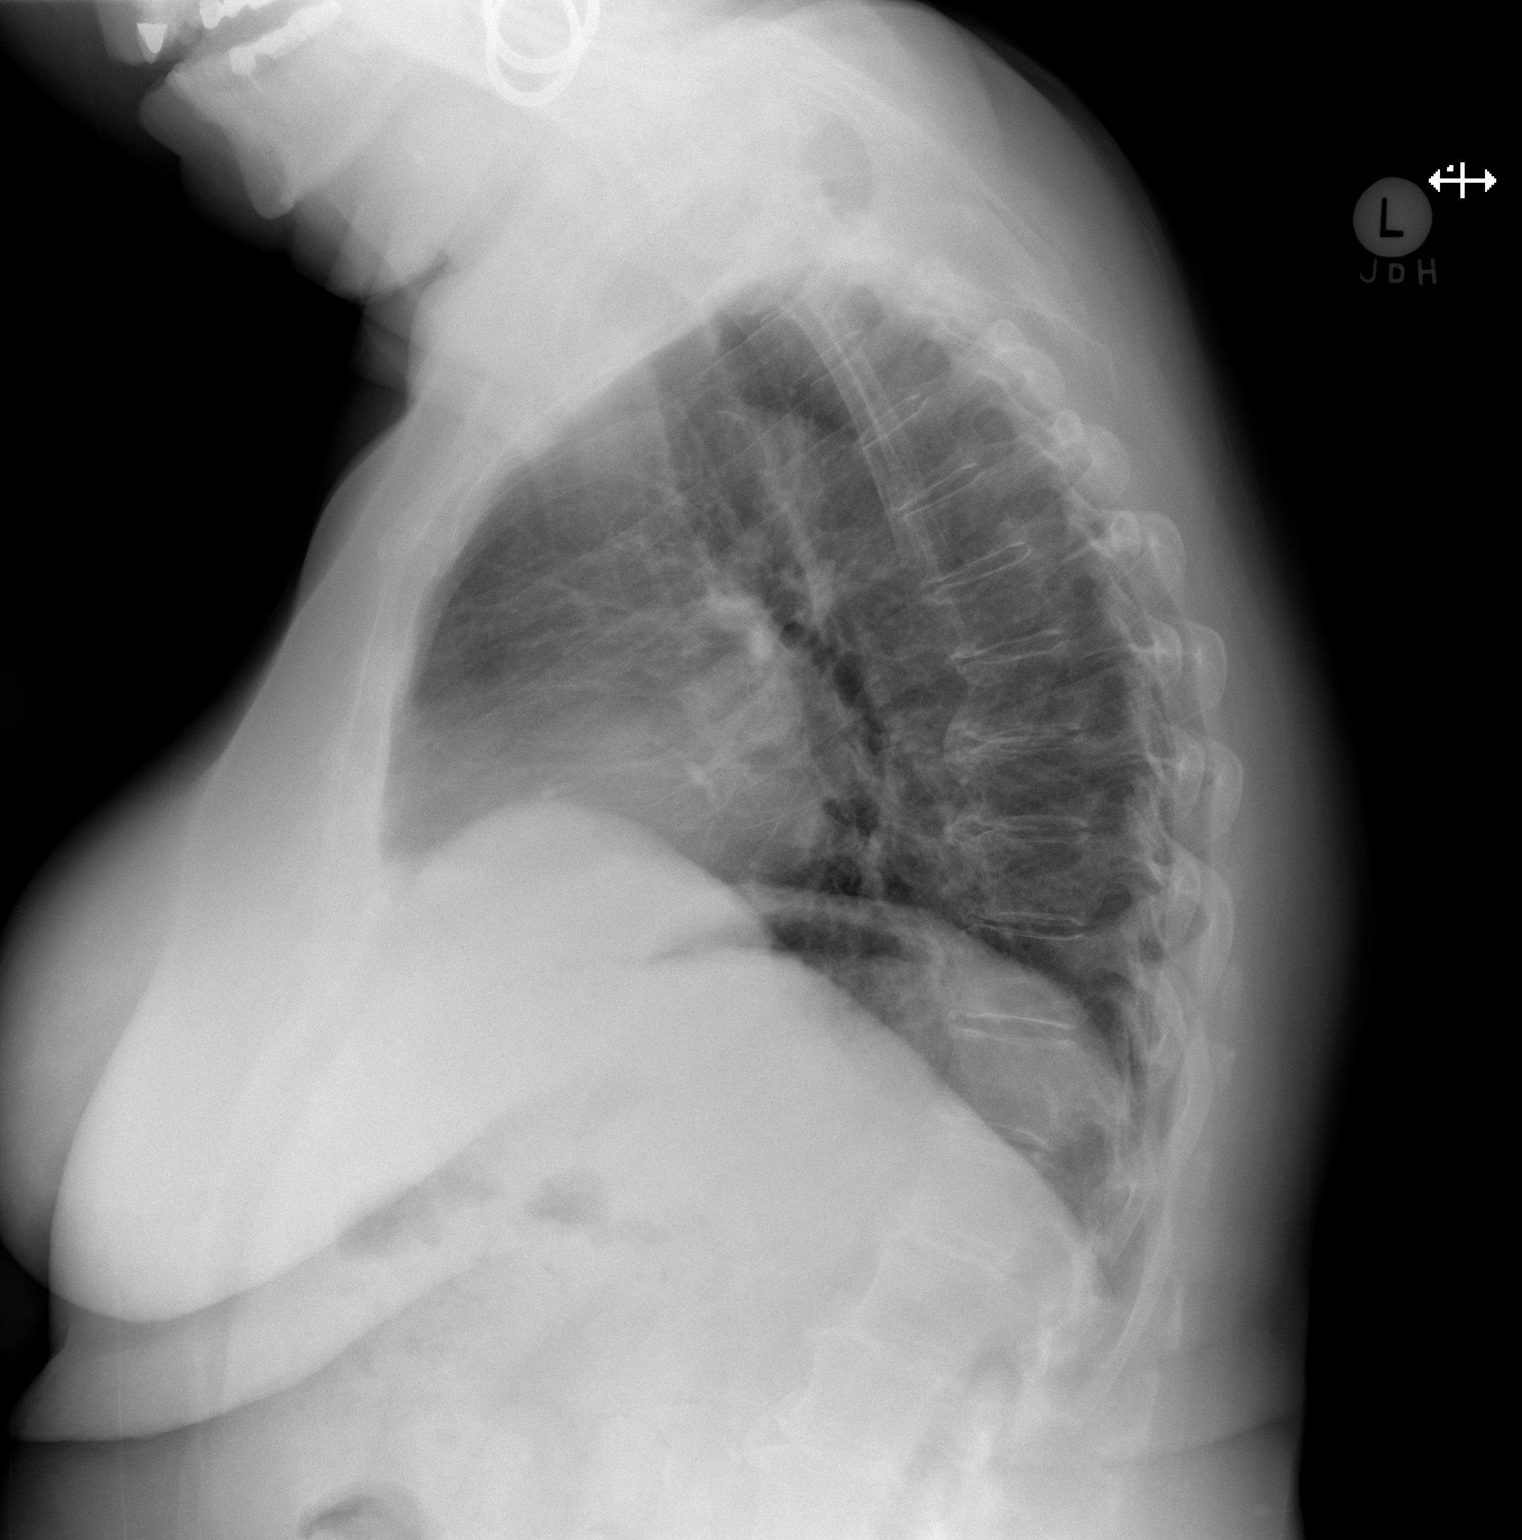

[2 of 2 positions shown; findings below may reference images not displayed]

FINDINGS: Chronic eventration of the right diaphragm. There is no edema,
consolidation, effusion, or pneumothorax. Normal heart size and
stable aortic contours. Atherosclerotic calcification.
IMPRESSION: No evidence of active disease.  Stable from 4285.
